# Patient Record
Sex: Female | Born: 1940 | Race: Asian | Hispanic: No | Marital: Married | Smoking: Never smoker
Health system: Southern US, Community
[De-identification: ages and names within clinical notes are randomized; demographics above are authoritative.]

## PROBLEM LIST (undated history)

## (undated) DIAGNOSIS — R040 Epistaxis: Secondary | ICD-10-CM

## (undated) DIAGNOSIS — G459 Transient cerebral ischemic attack, unspecified: Secondary | ICD-10-CM

## (undated) DIAGNOSIS — H409 Unspecified glaucoma: Secondary | ICD-10-CM

## (undated) DIAGNOSIS — I639 Cerebral infarction, unspecified: Secondary | ICD-10-CM

## (undated) DIAGNOSIS — F419 Anxiety disorder, unspecified: Secondary | ICD-10-CM

## (undated) DIAGNOSIS — I77819 Aortic ectasia, unspecified site: Secondary | ICD-10-CM

## (undated) DIAGNOSIS — A159 Respiratory tuberculosis unspecified: Secondary | ICD-10-CM

## (undated) DIAGNOSIS — J189 Pneumonia, unspecified organism: Secondary | ICD-10-CM

## (undated) DIAGNOSIS — I2510 Atherosclerotic heart disease of native coronary artery without angina pectoris: Secondary | ICD-10-CM

## (undated) DIAGNOSIS — R011 Cardiac murmur, unspecified: Secondary | ICD-10-CM

## (undated) DIAGNOSIS — J449 Chronic obstructive pulmonary disease, unspecified: Secondary | ICD-10-CM

## (undated) DIAGNOSIS — T7840XA Allergy, unspecified, initial encounter: Secondary | ICD-10-CM

## (undated) DIAGNOSIS — I10 Essential (primary) hypertension: Secondary | ICD-10-CM

## (undated) DIAGNOSIS — Z954 Presence of other heart-valve replacement: Secondary | ICD-10-CM

## (undated) DIAGNOSIS — Z5189 Encounter for other specified aftercare: Secondary | ICD-10-CM

## (undated) DIAGNOSIS — IMO0002 Reserved for concepts with insufficient information to code with codable children: Secondary | ICD-10-CM

## (undated) DIAGNOSIS — Z952 Presence of prosthetic heart valve: Secondary | ICD-10-CM

## (undated) DIAGNOSIS — D649 Anemia, unspecified: Secondary | ICD-10-CM

## (undated) DIAGNOSIS — I509 Heart failure, unspecified: Secondary | ICD-10-CM

## (undated) DIAGNOSIS — I4891 Unspecified atrial fibrillation: Secondary | ICD-10-CM

## (undated) HISTORY — DX: Reserved for concepts with insufficient information to code with codable children: IMO0002

## (undated) HISTORY — DX: Presence of other heart-valve replacement: Z95.4

## (undated) HISTORY — DX: Aortic ectasia, unspecified site: I77.819

## (undated) HISTORY — DX: Essential (primary) hypertension: I10

## (undated) HISTORY — PX: CARDIAC VALVE REPLACEMENT: SHX585

## (undated) HISTORY — DX: Encounter for other specified aftercare: Z51.89

## (undated) HISTORY — DX: Allergy, unspecified, initial encounter: T78.40XA

## (undated) HISTORY — DX: Anxiety disorder, unspecified: F41.9

## (undated) HISTORY — DX: Pneumonia, unspecified organism: J18.9

## (undated) HISTORY — DX: Anemia, unspecified: D64.9

## (undated) HISTORY — DX: Unspecified atrial fibrillation: I48.91

## (undated) HISTORY — DX: Unspecified glaucoma: H40.9

## (undated) HISTORY — DX: Transient cerebral ischemic attack, unspecified: G45.9

## (undated) HISTORY — PX: CARDIAC SURGERY: SHX584

## (undated) HISTORY — DX: Heart failure, unspecified: I50.9

## (undated) HISTORY — DX: Presence of prosthetic heart valve: Z95.2

## (undated) HISTORY — DX: Cerebral infarction, unspecified: I63.9

## (undated) HISTORY — DX: Cardiac murmur, unspecified: R01.1

## (undated) HISTORY — DX: Respiratory tuberculosis unspecified: A15.9

## (undated) HISTORY — DX: Atherosclerotic heart disease of native coronary artery without angina pectoris: I25.10

## (undated) HISTORY — DX: Chronic obstructive pulmonary disease, unspecified: J44.9

---

## 1997-08-14 ENCOUNTER — Ambulatory Visit (HOSPITAL_COMMUNITY): Admission: RE | Admit: 1997-08-14 | Discharge: 1997-08-14 | Payer: Self-pay | Admitting: Family Medicine

## 1999-01-21 ENCOUNTER — Ambulatory Visit (HOSPITAL_COMMUNITY): Admission: RE | Admit: 1999-01-21 | Discharge: 1999-01-21 | Payer: Self-pay | Admitting: Internal Medicine

## 1999-01-21 ENCOUNTER — Encounter: Payer: Self-pay | Admitting: Internal Medicine

## 2000-03-03 ENCOUNTER — Ambulatory Visit (HOSPITAL_COMMUNITY): Admission: RE | Admit: 2000-03-03 | Discharge: 2000-03-03 | Payer: Self-pay | Admitting: Internal Medicine

## 2000-03-03 ENCOUNTER — Encounter: Payer: Self-pay | Admitting: Internal Medicine

## 2000-07-03 ENCOUNTER — Emergency Department (HOSPITAL_COMMUNITY): Admission: EM | Admit: 2000-07-03 | Discharge: 2000-07-03 | Payer: Self-pay | Admitting: Emergency Medicine

## 2001-03-14 ENCOUNTER — Emergency Department (HOSPITAL_COMMUNITY): Admission: EM | Admit: 2001-03-14 | Discharge: 2001-03-14 | Payer: Self-pay | Admitting: Emergency Medicine

## 2001-07-23 ENCOUNTER — Emergency Department (HOSPITAL_COMMUNITY): Admission: EM | Admit: 2001-07-23 | Discharge: 2001-07-23 | Payer: Self-pay

## 2002-01-17 ENCOUNTER — Encounter: Payer: Self-pay | Admitting: *Deleted

## 2002-01-17 ENCOUNTER — Emergency Department (HOSPITAL_COMMUNITY): Admission: AC | Admit: 2002-01-17 | Discharge: 2002-01-17 | Payer: Self-pay

## 2002-05-08 ENCOUNTER — Inpatient Hospital Stay (HOSPITAL_COMMUNITY): Admission: EM | Admit: 2002-05-08 | Discharge: 2002-05-08 | Payer: Self-pay | Admitting: Emergency Medicine

## 2002-05-08 ENCOUNTER — Encounter: Payer: Self-pay | Admitting: Emergency Medicine

## 2002-05-20 ENCOUNTER — Ambulatory Visit (HOSPITAL_COMMUNITY): Admission: RE | Admit: 2002-05-20 | Discharge: 2002-05-20 | Payer: Self-pay | Admitting: Internal Medicine

## 2002-05-20 ENCOUNTER — Encounter: Payer: Self-pay | Admitting: Internal Medicine

## 2003-07-31 ENCOUNTER — Encounter: Admission: RE | Admit: 2003-07-31 | Discharge: 2003-07-31 | Payer: Self-pay | Admitting: Internal Medicine

## 2005-01-31 ENCOUNTER — Ambulatory Visit: Payer: Self-pay | Admitting: Internal Medicine

## 2006-02-04 ENCOUNTER — Ambulatory Visit: Payer: Self-pay | Admitting: Internal Medicine

## 2008-08-26 ENCOUNTER — Emergency Department (HOSPITAL_COMMUNITY): Admission: EM | Admit: 2008-08-26 | Discharge: 2008-08-26 | Payer: Self-pay | Admitting: Emergency Medicine

## 2009-02-14 ENCOUNTER — Encounter: Payer: Self-pay | Admitting: Internal Medicine

## 2009-07-04 ENCOUNTER — Encounter: Payer: Self-pay | Admitting: Internal Medicine

## 2009-11-02 ENCOUNTER — Encounter: Payer: Self-pay | Admitting: Internal Medicine

## 2010-02-25 ENCOUNTER — Encounter: Payer: Self-pay | Admitting: Internal Medicine

## 2010-03-20 ENCOUNTER — Encounter: Payer: Self-pay | Admitting: Internal Medicine

## 2010-04-12 ENCOUNTER — Encounter: Payer: Self-pay | Admitting: Internal Medicine

## 2010-06-06 NOTE — Letter (Signed)
Summary: [facility] Cardiology Return Visit   [facility] Cardiology Return Visit   Imported By: Roderic Ovens 04/22/2010 15:39:32  _____________________________________________________________________  External Attachment:    Type:   Image     Comment:   External Document

## 2010-06-06 NOTE — Progress Notes (Signed)
Summary: Office Visit  Office Visit   Imported By: Earl Many 04/03/2010 16:48:46  _____________________________________________________________________  External Attachment:    Type:   Image     Comment:   External Document

## 2010-06-06 NOTE — Progress Notes (Signed)
Summary: [facility]   Imported By: Harlon Flor 07/30/2009 16:20:41  _____________________________________________________________________  External Attachment:    Type:   Image     Comment:   External Document

## 2010-06-06 NOTE — Letter (Signed)
Summary: [facility] Return Office Visit Note   [facility] Return Office Visit Note   Imported By: Roderic Ovens 04/12/2010 13:11:22  _____________________________________________________________________  External Attachment:    Type:   Image     Comment:   External Document

## 2010-06-06 NOTE — Letter (Signed)
Summary: [facility]   [facility]   Imported By: Roderic Ovens 11/28/2009 09:47:58  _____________________________________________________________________  External Attachment:    Type:   Image     Comment:   External Document

## 2010-08-14 LAB — URINALYSIS, ROUTINE W REFLEX MICROSCOPIC
Bilirubin Urine: NEGATIVE
Glucose, UA: NEGATIVE mg/dL
Ketones, ur: NEGATIVE mg/dL
Specific Gravity, Urine: 1.006 (ref 1.005–1.030)
pH: 7 (ref 5.0–8.0)

## 2010-08-14 LAB — PROTIME-INR: Prothrombin Time: 22 seconds — ABNORMAL HIGH (ref 11.6–15.2)

## 2010-08-14 LAB — SAMPLE TO BLOOD BANK

## 2010-08-14 LAB — URINE MICROSCOPIC-ADD ON

## 2010-09-15 ENCOUNTER — Emergency Department (HOSPITAL_COMMUNITY)
Admission: EM | Admit: 2010-09-15 | Discharge: 2010-09-15 | Disposition: A | Discharge status: Home / Self Care | Payer: BC Managed Care – PPO | Attending: Emergency Medicine | Admitting: Emergency Medicine

## 2010-09-15 DIAGNOSIS — R040 Epistaxis: Secondary | ICD-10-CM | POA: Insufficient documentation

## 2010-09-15 DIAGNOSIS — I10 Essential (primary) hypertension: Secondary | ICD-10-CM | POA: Insufficient documentation

## 2010-09-15 DIAGNOSIS — I4891 Unspecified atrial fibrillation: Secondary | ICD-10-CM | POA: Insufficient documentation

## 2010-09-15 DIAGNOSIS — Z7901 Long term (current) use of anticoagulants: Secondary | ICD-10-CM | POA: Insufficient documentation

## 2010-09-15 LAB — DIFFERENTIAL
Basophils Absolute: 0 10*3/uL (ref 0.0–0.1)
Eosinophils Absolute: 0 10*3/uL (ref 0.0–0.7)
Eosinophils Relative: 1 % (ref 0–5)
Monocytes Absolute: 0.3 10*3/uL (ref 0.1–1.0)

## 2010-09-15 LAB — CBC
HCT: 39.5 % (ref 36.0–46.0)
MCHC: 32.2 g/dL (ref 30.0–36.0)
MCV: 87.2 fL (ref 78.0–100.0)
Platelets: 250 10*3/uL (ref 150–400)
RDW: 12.8 % (ref 11.5–15.5)
WBC: 4.7 10*3/uL (ref 4.0–10.5)

## 2010-09-15 LAB — PROTIME-INR: INR: 1.79 — ABNORMAL HIGH (ref 0.00–1.49)

## 2010-09-20 NOTE — H&P (Signed)
NAME:  [name], [name] NO.:  [id number]   MEDICAL RECORD NO.:  [id number]                   PATIENT TYPE:  INP   LOCATION:  1830                                 FACILITY:  [facility]   PHYSICIAN:  [facility] Salvia, M.D. [facility]           DATE OF BIRTH:  1941-04-15   DATE OF ADMISSION:  05/08/2002  DATE OF DISCHARGE:                                HISTORY & PHYSICAL   HISTORY OF PRESENT ILLNESS:  The patient is a 70 year old Oriental-American  who presents this evening with chest pain.   She has a long history of hypertension for which she does not take her  medications regularly because the Ziac is associated with sleepiness.  She  has a family history of vascular disease with a stroke in her mom and has  had a transient neurological episode with loss of central vision, the  mechanism which is not clear for which she has seen Candy Sledge, M.D.   This morning she awaken from sleep with a pain in her pain/tightness in her  chest, across her shoulders, and into her left arm.  It was associated with  diaphoresis.  When she took her pulse, which she does on a regular basis,  she noted that it was not the normal 60-70 and regular, but it was very fast  and variable amplitude.  She took her blood pressure via her machine.  It  was noted to be 180/130 with a pulse of 135.  She took an extra Ziac and  brought herself to the emergency room.  By the time she had gotten here, the  symptoms had abated.  She has never had similar symptoms before.  She does  have intermittent left arm discomfort, which is nonexertional.  Her cardiac  risk factors as noted above.  She has no exercise intolerance.  She has no  prior syncope and no prior history of palpitations.   She has noted no change in her weight, change in the texture of her hair,  weight loss, or diaphoresis.   PAST MEDICAL HISTORY:  Notable for epistaxis for which she sees Zola Button T.  Lazarus Salines, M.D.  This has  been a recurring problem requiring multiple visits  to the emergency room and caused her to discontinue the aspirin recommended  as part of her transient neurological process.   Her past medical history is otherwise unrevealing.  Her lipid status is not  known.   PAST SURGICAL HISTORY:  Negative.   MEDICATIONS:  1. Ziac every once in a while.  2. Aspirin less frequently.   ALLERGIES:  She has no known drug allergies.   SOCIAL HISTORY:  She is married.  Currently this is some stress between she  and her husband.  Her 28 year old son is a Ph.D. candidate at [employer] in  Environmental manager.   PHYSICAL EXAMINATION:  GENERAL APPEARANCE:  She is an elderly [place]-  American female in no acute distress.  VITAL SIGNS:  Her blood pressure is 148/96 and her pulse was pulse 64 and  regular.  HEENT:  No scleral icterus nor xanthomata.  NECK:  Neck veins were flat.  The carotids were brisk and full bilaterally  without bruit.  A funduscopic exam was not undertaken.  BACK:  Without kyphosis or scoliosis.  HEART:  The heart sounds were notable for an S4 without murmurs.  ABDOMEN:  Soft with active bowel sounds without midline pulsation or  hepatomegaly.  EXTREMITIES:  Femoral pulses were 2+.  Distal pulses were intact.  There was  no cyanosis, clubbing, or edema.  NEUROLOGIC:  Grossly normal.  SKIN:  Warm and dry.  LYMPHATICS:  There was no lymphadenopathy.   LABORATORY DATA:  The electrocardiogram demonstrated sinus rhythm at 69 with  __________  0.15/0.08/0.037.  There was minor ST segment depression in leads  2, 3, and F and evidence of left atrial enlargement.   IMPRESSION:  1. Chest pain with typical and atypical features, but primarily related to     irregular tachypalpitations suggestive of atrial fibrillation.  2. Hypertension with medication noncompliance secondary to side effects.  3. Recurrent epistaxis on aspirin.  4. Prior transient visual defect, question mechanism.  5. Cardiac risk  factors:     a. Hypertension.     b. Family history with prior strokes.     c. Question cholesterol status.   The patient had tachypalpitations that are irregular that strongly suggest  atrial fibrillation, especially given the familiarity that she has with her  pulse.  This is concerning given her background history of hypertension, as  well as the background of a prior transient neurological episode.  Clarification from the neurologist would be help in that if they felt this  was a thromboembolic event then even without further documentation I think  Coumadin would probably be the right thing.  In the absence of that, ongoing  aspirin therapy and the need for documentation of her arrhythmia would be  crucial.  As she has had epistaxis which has precluded aspirin therapy,  follow-up with Zola Button T. Wolicki, M.D., for a cauterization would be prudent.   Given the chest pain and her risk factors, I think that outpatient  Cardiolite scanning would be appropriate.  Given the abnormalities on her  electrocardiogram, 2-D echocardiogram would be appropriate.  We also want to  check her thyroid given the symptoms that suggest atrial fibrillation.   I plan to see her in the office in about two weeks' time to review the  aforementioned results, as well as to check a BMET following introduction of  an ACE inhibitor for her blood pressure.                                               [facility] Salvia, M.D. [facility]    SCK/MEDQ  D:  05/08/2002  T:  05/08/2002  Job:  [id number]   cc:   Harrel Lemon. Merla Riches, M.D.  [address]  [place]  [place] [address]  Fax: [phone]

## 2010-09-20 NOTE — Discharge Summary (Signed)
NAME:  [name], [name]                     ACCOUNT NO.:  [id number]   MEDICAL RECORD NO.:  [id number]                   PATIENT TYPE:  INP   LOCATION:  2035                                 [facility]ACILITY[facility]  [facility]   PHYSICIAN:  Dr. Damaris Hippo                           DATE OF BIRTH:  [birth year]   DATE OF ADMISSION:  05/08/2002  DATE OF DISCHARGE:                           DISCHARGE SUMMARY - REFERRING   PROCEDURES:  None.   REASON FOR ADMISSION:  Please refer to dictated admission note.   LABORATORY DATA:  WBC 5.6.  HGB 15.  HCT 45.  Platelets 293.  Sodium 141.  Potassium 3.4.  Glucose 96.  BUN 17.  Creatinine 0.6.  CBK-MB Normal times  two: Troponin I 0.01 (times 2), less than 0.01.  TSH pending.  Lipid profile  pending.   HOSPITAL COURSE:  Patient is a 70 year old female, with a prior history of  heart disease, and cardiac risk factors notable for hypertension and family  history of coronary artery disease, who presented to the emergency room with  chest pain and tachy palpitations.  She underwent evaluation by Dr. Sherryl Manges, and was admitted for overnight observation, rule out myocardial  infarction and further diagnostic evaluation.   Serial cardiac enzymes were all within normal limits.   RECOMMENDATIONS:  Proceed with early outpatient exercise stress Cardiolite  testing.  Arrangements were made to have this test done at the [facility]  office the day following discharge.   Laboratory data notable for marginal hypokalemia.  This was completed prior  to discharge.   Medication adjustments included addition of low dose Altace, by Dr. Graciela Husbands.  Patient is recommended to have a follow up metabolic profile with her  primary care physician in approximately one week.   Additional recommendations by Dr. Graciela Husbands were to schedule an outpatient  echocardiogram.  We will arrange for this as well, followed by return to the  office for follow up by Dr. Sherryl Manges.   DISCHARGE MEDICATIONS:   Aspirin 81 mg, __________  2.5 mg every other day,  Altace 2.5. mg q.d.   INSTRUCTIONS:  Patient will be scheduled for an exercise stress Cardiolite  test on Monday, January 5, at [facility].  Arrangements will be made  through our office and patient will be notified at home.  She has been  instructed to refrain from eating or drinking after midnight and to refrain  from any caffeinated beverages on morning of the test.  Patient will also be  scheduled for a 2-D echocardiogram.  Patient will then return to the office  in approximately 2-1/2-3 weeks for cardiac followup with Dr. Sherryl Manges.   DISCHARGE DIAGNOSES:  1. Chest pain.     A. Normal serial cardiac enzymes.     B. Scheduled for outpatient stress Cardiolite testing.  2. Hypertension.  3. Family history of coronary artery disease.  4. Tachy palpitations.  5. Recurrent epistaxis.     A. On aspirin.  6. Mild hypokalemia.     Gene Serpe, P.A. [facility]                      Dr. Damaris Hippo    GS/MEDQ  D:  05/08/2002  T:  05/08/2002  Job:  [id number]   cc:   [facility]  [facility]  [address]

## 2010-09-20 NOTE — Assessment & Plan Note (Signed)
[facility]                           ELECTROPHYSIOLOGY OFFICE NOTE   NAME:[name], [name] KISHI                  MRN:          [id number]  DATE:02/04/2006                            DOB:          1940/05/18    [name] is seen.  She has a history of palpitations and hypertension.  She is doing quite well currently.   She had a couple of issues.  One is concern about jury duty with her  hypertension.  The other is that she is having nasal congestion,  particularly when she sleeps.  Her mattress was last changed 20 years ago.  Her pillow was last changed 2 years ago and she changes her pillowcase every  day.   Her medications include Altace 5 and aspirin 81.   EXAMINATION:  Her blood pressure today was under great control at 128/82,  pulse was 67.  Lungs were clear.  Heart sounds were regular.  The extremity  were without edema.   Electrocardiogram dated today demonstrated 21 with intervals of  0.14/0.08/0.40.  The axis was 58 degrees.   IMPRESSION:  1. Hypertension - well controlled.  2. Palpitations - quiescent.  3. Probable nasal allergies.   [name] is stable.  We will plan to see her again in 1 year's time.  I have given her a letter that says that she is under our care for  hypertension.  I have also suggested that a mattress cover or a new mattress  after 20 years might help with her symptoms.            ______________________________  [facility] Salvia, MD, [facility]     SCK/MedQ  DD:  02/04/2006  DT:  02/05/2006  Job #:  [id number]

## 2010-09-20 NOTE — Assessment & Plan Note (Signed)
[facility]                                   ON-CALL NOTE   NAME:Fangman, CRYSTALL DONALDSON                  MRN:          [id number]  DATE:02/26/2006                            DOB:          [birth year]    PATIENT PHONE NUMBER:  [phone]   HISTORY:  Ms. Glazebrook is a patient followed by Dr. Graciela Husbands with a history of  hypertension and palpitations. She called the answering service tonight with  some concerns. She describes some unusual symptoms that occurred earlier  today and are now resolved. It is unclear as to why she called the answering  service now. She does note, that she had a flu [facility].  She had some discoloration of her lip, as well as some shaking of her upper  lip. She denies chest pain, shortness of breath, palpitations, syncope, pre-  syncope, difficulty swallowing or lip swelling. She denies any numbness or  tingling in her face, facial drooping, aphasia, or unilateral weakness.   PLAN:  I explained to the patient that none of her symptoms seemed to be  concerning. However, if she is concerned about her symptoms earlier today,  then she should go to emergency room. Otherwise, she should followup with  her physician at [facility].   DISPOSITION:  As noted above.    ______________________________  Tereso Newcomer, PA-C    SW/MedQ  DD: 02/26/2006  DT: 02/28/2006  Job #: [id number]

## 2010-09-20 NOTE — Assessment & Plan Note (Signed)
[facility]                                 ON-CALL NOTE   NAME:ESTERLINECandiss, [name]                    MRN:          [id number]  DATE:09/28/2006                            DOB:          May 15, 1940    PHONE NUMBER:  [phone].   PRIMARY CARDIOLOGIST:  [facility] Salvia, M.D., [facility]   HISTORY:  [name] is a 70 year old female patient who has seen Dr.  Graciela Husbands in the past for hypertension and palpitations.  She recently  developed an upper respiratory tract infection and saw Dr. Alwyn Ren with  [facility].  She apparently had a chest x-ray that showed an  enlarged heart.  She was concerned about this and finally decided to  call the answering service today.  She also had some concerns over  whether or not Altace could be causing nasal allergies.   PLAN:  I explained to [name] that with the history of  hypertension for many years, it is not unusual for her to have  cardiomegaly on her chest x-ray.  I did go back through E-chart and  found 2 other chest x-rays that showed mild cardiomegaly.  She was quite  concerned about this and even asked me at one point how long she had to  live.  I explained to her that this was nothing to be too concerned  about.  Followup has already been planned with Dr. Alwyn Ren with [facility].  I explained to her that she needs to seek out that care  as directed by Dr. Alwyn Ren.  I tried to reassure her.  She should await  further testing.  I asked her to follow up with her primary care  Abbas Beyene as well for her nasal allergies.   DISPOSITION:  I asked the patient to contact our office during our  normal operating hours to arrange an appointment with Dr. Graciela Husbands if she  continues to remain concerned about the cardiomegaly found on her chest  x-ray.      Tereso Newcomer, PA-C  Electronically Signed      Jesse Sans. Daleen Squibb, MD, [facility]  Electronically Signed   SW/MedQ  DD: 09/28/2006  DT: 09/28/2006  Job #:  [id number]

## 2010-09-26 ENCOUNTER — Emergency Department (HOSPITAL_COMMUNITY)
Admission: EM | Admit: 2010-09-26 | Discharge: 2010-09-27 | Disposition: A | Discharge status: Home / Self Care | Payer: BC Managed Care – PPO | Attending: Emergency Medicine | Admitting: Emergency Medicine

## 2010-09-26 DIAGNOSIS — I10 Essential (primary) hypertension: Secondary | ICD-10-CM | POA: Insufficient documentation

## 2010-09-26 DIAGNOSIS — R21 Rash and other nonspecific skin eruption: Secondary | ICD-10-CM | POA: Insufficient documentation

## 2010-09-26 DIAGNOSIS — L259 Unspecified contact dermatitis, unspecified cause: Secondary | ICD-10-CM | POA: Insufficient documentation

## 2010-09-26 DIAGNOSIS — I4891 Unspecified atrial fibrillation: Secondary | ICD-10-CM | POA: Insufficient documentation

## 2010-10-25 ENCOUNTER — Emergency Department (HOSPITAL_COMMUNITY)
Admission: EM | Admit: 2010-10-25 | Discharge: 2010-10-26 | Disposition: A | Discharge status: Home / Self Care | Payer: BC Managed Care – PPO | Attending: Emergency Medicine | Admitting: Emergency Medicine

## 2010-10-25 DIAGNOSIS — R040 Epistaxis: Secondary | ICD-10-CM | POA: Insufficient documentation

## 2010-10-25 DIAGNOSIS — Z79899 Other long term (current) drug therapy: Secondary | ICD-10-CM | POA: Insufficient documentation

## 2010-10-25 DIAGNOSIS — I4891 Unspecified atrial fibrillation: Secondary | ICD-10-CM | POA: Insufficient documentation

## 2010-10-25 DIAGNOSIS — Z7982 Long term (current) use of aspirin: Secondary | ICD-10-CM | POA: Insufficient documentation

## 2010-10-25 DIAGNOSIS — I10 Essential (primary) hypertension: Secondary | ICD-10-CM | POA: Insufficient documentation

## 2010-10-25 DIAGNOSIS — Z7901 Long term (current) use of anticoagulants: Secondary | ICD-10-CM | POA: Insufficient documentation

## 2010-10-25 LAB — POCT I-STAT, CHEM 8
BUN: 18 mg/dL (ref 6–23)
Calcium, Ion: 1.22 mmol/L (ref 1.12–1.32)
Chloride: 107 meq/L (ref 96–112)
Creatinine, Ser: 0.7 mg/dL (ref 0.50–1.10)
Glucose, Bld: 109 mg/dL — ABNORMAL HIGH (ref 70–99)
HCT: 42 % (ref 36.0–46.0)
Hemoglobin: 14.3 g/dL (ref 12.0–15.0)
Potassium: 3.9 meq/L (ref 3.5–5.1)
Sodium: 142 mEq/L (ref 135–145)
TCO2: 29 mmol/L (ref 0–100)

## 2010-10-25 LAB — PROTIME-INR: INR: 2.13 — ABNORMAL HIGH (ref 0.00–1.49)

## 2010-10-27 ENCOUNTER — Emergency Department (HOSPITAL_COMMUNITY)
Admission: EM | Admit: 2010-10-27 | Discharge: 2010-10-27 | Disposition: A | Discharge status: Home / Self Care | Payer: Medicare Other | Attending: Emergency Medicine | Admitting: Emergency Medicine

## 2010-10-27 DIAGNOSIS — R040 Epistaxis: Secondary | ICD-10-CM | POA: Insufficient documentation

## 2010-11-01 NOTE — Consult Note (Signed)
  NAMEKIMLA, FURTH NO.:  [id number]  MEDICAL RECORD NO.:  [id number]  LOCATION:  WLED                         FACILITY:  [facility]  PHYSICIAN:  Zola Button T. Lazarus Salines, M.D. DATE OF BIRTH:  [birth year]  DATE OF CONSULTATION:  10/27/2010 DATE OF DISCHARGE:  10/27/2010                                CONSULTATION   CHIEF COMPLAINT:  Epistaxis.  HISTORY OF PRESENT ILLNESS:  A 70 year old female is on Coumadin for episodic atrial fibrillation.  She had some bleeding 6 weeks ago which was cauterized in the emergency room.  She had bleeding again 2 days ago and required packing in the emergency room.  She has been bleeding through the packing.  Her INR at that time was 2.1.  No trauma to the nose.  No pain or obstruction.  She recalls me cauterizing her nose on the opposite side 10+ years ago with good control.  She has no other known bleeding tendencies.  PHYSICAL EXAMINATION:  This is a older middle-aged female in no distress.  There was a balloon pack protruding from her left nostril. Mental status is appropriate.  Ears are clear with normal aerated drums. Oral cavity shows a small amount of old blood.  The anterior nose shows slight scarring in the right septum.  After removing the left nasal balloon pack, there was a small granuloma on the anterior septum.  Neck exam unremarkable.  IMPRESSION:  Left anterior septal granuloma with epistaxis.  Aggravated by Coumadin therapy.  PLAN:  With informed consent, I anesthetized the anterior nose with topical 4% cocaine solution.  After allowing several minutes for this to take effect, silver nitrate cautery was performed on the granuloma with good tolerance and good success.  A small amount of bacitracin ointment was applied.  I discussed nasal hygiene with her including neti pot rinses, saline spray for moistening, and bacitracin ointment twice daily for couple of weeks.  She can resume normal activity.  She can resume  Coumadin therapy.  Recheck in my office 1 month as needed.  She understands and agrees.     Gloris Manchester. Lazarus Salines, M.D.     KTW/MEDQ  D:  10/27/2010  T:  10/27/2010  Job:  [id number]  Electronically Signed by Flo Shanks M.D. on 11/01/2010 08:57:28 AM

## 2011-05-27 ENCOUNTER — Ambulatory Visit (INDEPENDENT_AMBULATORY_CARE_PROVIDER_SITE_OTHER): Payer: Medicare Other

## 2011-05-27 DIAGNOSIS — I4891 Unspecified atrial fibrillation: Secondary | ICD-10-CM

## 2011-05-27 DIAGNOSIS — E559 Vitamin D deficiency, unspecified: Secondary | ICD-10-CM

## 2011-05-27 DIAGNOSIS — I10 Essential (primary) hypertension: Secondary | ICD-10-CM

## 2011-05-27 DIAGNOSIS — Z0000 Encounter for general adult medical examination without abnormal findings: Secondary | ICD-10-CM

## 2011-05-27 DIAGNOSIS — Z23 Encounter for immunization: Secondary | ICD-10-CM

## 2011-06-23 ENCOUNTER — Ambulatory Visit (INDEPENDENT_AMBULATORY_CARE_PROVIDER_SITE_OTHER): Payer: BC Managed Care – PPO | Admitting: Internal Medicine

## 2011-06-23 VITALS — BP 118/68 | HR 64 | Temp 98.1°F | Resp 16 | Ht 63.5 in | Wt 145.5 lb

## 2011-06-23 DIAGNOSIS — I4891 Unspecified atrial fibrillation: Secondary | ICD-10-CM

## 2011-06-23 DIAGNOSIS — I10 Essential (primary) hypertension: Secondary | ICD-10-CM | POA: Insufficient documentation

## 2011-06-23 DIAGNOSIS — R05 Cough: Secondary | ICD-10-CM

## 2011-06-23 DIAGNOSIS — R059 Cough, unspecified: Secondary | ICD-10-CM

## 2011-06-23 LAB — PROTIME-INR: INR: 2.45 — ABNORMAL HIGH (ref <1.50)

## 2011-06-23 MED ORDER — HYDROCODONE-HOMATROPINE 5-1.5 MG/5ML PO SYRP: 5.0000 mL | ORAL_SOLUTION | Freq: Three times a day (TID) | ORAL | Status: DC | PRN

## 2011-06-23 NOTE — Progress Notes (Signed)
  Subjective:    Patient ID: [name], female    DOB: [birth year], 71 y.o.   MRN: [id number]  HPI She developed a fever with diarrhea and lost 7 pounds over 2 days about 6 days ago. As this resolves she developed cough. She can't sleep due to the cough the cough is nonproductive and there is no further fever . She denies signs of a sinus infection and there is no sore throat.  Her atrial fibrillation has been stable and she is follow up with cardiology in 7-10 days. It is time to check her PT and INR so we can forward that to him    Review of Systemshas been stable since her physical examination one month ago     Objective:   Physical Exam  The tympanic membranes are clear, the nose has clear rhinorrhea, oropharynx is clear, there are no anterior cervical nodes. The lungs are clear The heart has a regular rhythm with a rate of 70 Skin shows no excessive bruising     Assessment & Plan:  Problem #1 cough secondary to viral illness She will continue Delsym and add Hycodan at bedtime  Problem #2 atrial fibrillation PT/INR done and will forward that to pharmacist Wynne Dust and her cardiologist

## 2011-06-30 DIAGNOSIS — Z8611 Personal history of tuberculosis: Secondary | ICD-10-CM | POA: Insufficient documentation

## 2011-06-30 DIAGNOSIS — I059 Rheumatic mitral valve disease, unspecified: Secondary | ICD-10-CM | POA: Insufficient documentation

## 2011-06-30 DIAGNOSIS — I4819 Other persistent atrial fibrillation: Secondary | ICD-10-CM | POA: Insufficient documentation

## 2011-07-10 ENCOUNTER — Encounter: Payer: Self-pay | Admitting: Internal Medicine

## 2011-09-02 ENCOUNTER — Telehealth: Payer: Self-pay

## 2011-09-02 NOTE — Telephone Encounter (Signed)
Faxed over the most recent OV and Labs that are in Epic.  I looked in patients chart for an EKG and did not see one.  Also if they are requesting stress test and Drug test they will need to get that from the office that the patient had them done at.  I will need a signed release if they will any further Ov's or labs from this patients chart, before I send anything over to them.

## 2011-09-02 NOTE — Telephone Encounter (Signed)
Cheryl from [facility] requesting pt ov notes, labs, stress test, drug tests anything that will help them do an evaluation on pt she is needing this info before the 7th of may pt has appt on that day. Please contact Elnita Maxwell [phone] fax all info to [phone]

## 2011-09-09 DIAGNOSIS — G459 Transient cerebral ischemic attack, unspecified: Secondary | ICD-10-CM | POA: Insufficient documentation

## 2011-09-14 ENCOUNTER — Ambulatory Visit (INDEPENDENT_AMBULATORY_CARE_PROVIDER_SITE_OTHER): Payer: BC Managed Care – PPO | Admitting: Internal Medicine

## 2011-09-14 VITALS — BP 106/71 | HR 120 | Temp 97.8°F | Resp 16 | Ht 63.5 in | Wt 141.0 lb

## 2011-09-14 DIAGNOSIS — R0602 Shortness of breath: Secondary | ICD-10-CM

## 2011-09-14 DIAGNOSIS — I77810 Thoracic aortic ectasia: Secondary | ICD-10-CM

## 2011-09-14 DIAGNOSIS — I4891 Unspecified atrial fibrillation: Secondary | ICD-10-CM

## 2011-09-14 DIAGNOSIS — I10 Essential (primary) hypertension: Secondary | ICD-10-CM

## 2011-09-14 DIAGNOSIS — G459 Transient cerebral ischemic attack, unspecified: Secondary | ICD-10-CM | POA: Insufficient documentation

## 2011-09-14 DIAGNOSIS — I340 Nonrheumatic mitral (valve) insufficiency: Secondary | ICD-10-CM

## 2011-09-14 LAB — POCT CBC
HCT, POC: 41.7 % (ref 37.7–47.9)
Hemoglobin: 13 g/dL (ref 12.2–16.2)
Lymph, poc: 2.7 (ref 0.6–3.4)
MCH, POC: 26.6 pg — AB (ref 27–31.2)
MCHC: 31.2 g/dL — AB (ref 31.8–35.4)
MCV: 85.2 fL (ref 80–97)
POC LYMPH PERCENT: 49.4 %L (ref 10–50)
RDW, POC: 15.3 %
WBC: 5.5 10*3/uL (ref 4.6–10.2)

## 2011-09-14 LAB — COMPREHENSIVE METABOLIC PANEL
Alkaline Phosphatase: 66 U/L (ref 39–117)
BUN: 17 mg/dL (ref 6–23)
CO2: 29 mEq/L (ref 19–32)
Creat: 0.79 mg/dL (ref 0.50–1.10)
Glucose, Bld: 101 mg/dL — ABNORMAL HIGH (ref 70–99)
Sodium: 140 mEq/L (ref 135–145)
Total Bilirubin: 0.9 mg/dL (ref 0.3–1.2)
Total Protein: 7.1 g/dL (ref 6.0–8.3)

## 2011-09-14 LAB — PROTIME-INR: Prothrombin Time: 21.2 seconds — ABNORMAL HIGH (ref 11.6–15.2)

## 2011-09-14 LAB — BRAIN NATRIURETIC PEPTIDE: Brain Natriuretic Peptide: 1311.7 pg/mL — ABNORMAL HIGH (ref 0.0–100.0)

## 2011-09-14 LAB — TSH: TSH: 0.968 u[IU]/mL (ref 0.350–4.500)

## 2011-09-14 NOTE — Progress Notes (Signed)
Subjective:    Patient ID: [name], female    DOB: 1941/04/19, 71 y.o.   MRN: [id number]  Palpitations  Pertinent negatives include no chest pain, coughing, fever or nausea.   Patient Active Problem List  Diagnoses  . Essential hypertension, benign  . Atrial fibrillation  . Ascending aorta dilatation  . Mitral regurgitation  . TIA (transient ischemic attack)  At her third episode of atrial for in April she was placed on sotalol after she converted. She had another episode in early May and was referred to the electrophysiologist. An ablation is planned. She has now had a loss of rate control over the last 24 hours And has shortness of breath and difficulty talking with any activity. She is stable as long as she sits still. She describes orthopnea ever since starting sotalol as well as fatigue and dyspnea on exertion. She has no edema. She restarted Coumadin 6 weeks ago after stopping it in February because she felt good. She has not had a PT/INR since restarting. She has never had heart failure. She worked in the garden yesterday most of the day before having her heart rate go up again. She has had a few episodes of spontaneous conversion,Most recently on the day she saw the electrophysiologist Dr Cyndy Freeze. Her primary cardiologist at [facility] is Dr. Versie Starks.      Review of Systems  Constitutional: Positive for activity change and fatigue. Negative for fever and unexpected weight change.  Eyes: Negative for visual disturbance.  Respiratory: Negative for cough, choking, chest tightness and wheezing.   Cardiovascular: Positive for palpitations. Negative for chest pain and leg swelling.  Gastrointestinal: Negative for nausea and diarrhea.  Genitourinary: Negative for difficulty urinating.  Musculoskeletal: Negative for back pain.  Psychiatric/Behavioral: Negative for confusion, dysphoric mood and agitation.       Objective:   Physical Exam In no acute distress Pupils  equal round reactive to light and accommodation No carotid bruits Heart with irregular and rapid rhythm rate 125 No murmurs appreciated Lungs are clear Extremities show no peripheral edema and have full peripheral pulses Skin is clear without cyanosis or ecchymoses        Results for orders placed in visit on 09/14/11  POCT CBC      Component Value Range   WBC 5.5  4.6 - 10.2 (K/uL)   Lymph, poc 2.7  0.6 - 3.4    POC LYMPH PERCENT 49.4  10 - 50 (%L)   MID (cbc) 0.3  0 - 0.9    POC MID % 5.9  0 - 12 (%M)   POC Granulocyte 2.5  2 - 6.9    Granulocyte percent 44.7  37 - 80 (%G)   RBC 4.89  4.04 - 5.48 (M/uL)   Hemoglobin 13.0  12.2 - 16.2 (g/dL)   HCT, POC 40.9  81.1 - 47.9 (%)   MCV 85.2  80 - 97 (fL)   MCH, POC 26.6 (*) 27 - 31.2 (pg)   MCHC 31.2 (*) 31.8 - 35.4 (g/dL)   RDW, POC 91.4     Platelet Count, POC 331  142 - 424 (K/uL)   MPV 7.7  0 - 99.8 (fL)    Assessment & Plan:  Problem #1 atrial fibrillation with loss of both rate and rhythm control Since last labs were in January Will recheck metabolic profile and TSH Problem #2 recent fatigue and dyspnea on exertion-possibly secondary to sotalol since there are no signs of heart failure/will check BNP  Problem #3 hypertension With blood pressure 100/70 She is advised to stop altace Problem #4 Coumadin PT/INR checked  Placed a call to [facility]-Dr. Orpah Greek called Dr. Wilford Grist who feels we should let her followup with him tomorrow for medication adjustment for ablation and of course go to the emergency room if she develops shortness of breath or dizziness

## 2011-09-14 NOTE — Progress Notes (Signed)
Subjective:    Patient ID: [name], female    DOB: 1940-07-20, 71 y.o.   MRN: [id number]  HPI Patient Active Problem List  Diagnoses  . Essential hypertension, benign  . Atrial fibrillation  . Ascending aorta dilatation  . Mitral regurgitation  . TIA (transient ischemic attack)  At her third episode of atrial for in April she was placed on sotalol after she converted. She had another episode in early age and was referred to the electrophysiologist. An ablation is planned. She has now had a loss of rate control over the last 24 hours And has shortness of breath and difficulty talking with any activity. She is stable as long as she sits still. She describes orthopnea ever since starting sotalol as well as fatigue and dyspnea on exertion. She has no edema. She restarted Coumadin 6 weeks ago after stopping it in February because she felt good. She has not had a PT/INR since restarting. She has never had heart failure. She worked in the garden yesterday most of the day before having her heart rate go up again. She has had a few episodes of spontaneous conversion,Most recently on the day she saw the electrophysiologist Dr Cyndy Freeze. Her primary cardiologist at [facility] is Dr. Versie Starks.      Review of Systems  Constitutional: Positive for activity change and fatigue. Negative for fever and unexpected weight change.  Eyes: Negative for visual disturbance.  Respiratory: Negative for cough, choking, chest tightness and wheezing.   Cardiovascular: Negative for chest pain and leg swelling.  Gastrointestinal: Negative for nausea and diarrhea.  Genitourinary: Negative for difficulty urinating.  Musculoskeletal: Negative for back pain.  Psychiatric/Behavioral: Negative for confusion, dysphoric mood and agitation.       Objective:   Physical ExamIn no acute distress Pupils equal round reactive to light and accommodation No carotid bruits Heart with irregular and rapid rhythm rate  125 No murmurs appreciated Lungs are clear Extremities show no peripheral edema and have full peripheral pulses Skin is clear without cyanosis or ecchymoses        Results for orders placed in visit on 09/14/11  POCT CBC      Component Value Range   WBC 5.5  4.6 - 10.2 (K/uL)   Lymph, poc 2.7  0.6 - 3.4    POC LYMPH PERCENT 49.4  10 - 50 (%L)   MID (cbc) 0.3  0 - 0.9    POC MID % 5.9  0 - 12 (%M)   POC Granulocyte 2.5  2 - 6.9    Granulocyte percent 44.7  37 - 80 (%G)   RBC 4.89  4.04 - 5.48 (M/uL)   Hemoglobin 13.0  12.2 - 16.2 (g/dL)   HCT, POC 40.9  81.1 - 47.9 (%)   MCV 85.2  80 - 97 (fL)   MCH, POC 26.6 (*) 27 - 31.2 (pg)   MCHC 31.2 (*) 31.8 - 35.4 (g/dL)   RDW, POC 91.4     Platelet Count, POC 331  142 - 424 (K/uL)   MPV 7.7  0 - 99.8 (fL)    Assessment & Plan:  Problem #1 atrial fibrillation with loss of both rate and rhythm control Since last labs were in January Will recheck metabolic profile and TSH Problem #2 recent fatigue and dyspnea on exertion-possibly secondary to sotalol since there are no signs of heart failure Problem #3 hypertension With blood pressure 100/70  It is advisable to stop altace Problem #4 Coumadin PT/INR checked  Placed a call to [facility] cardiology

## 2011-09-15 ENCOUNTER — Encounter: Payer: Self-pay | Admitting: Internal Medicine

## 2011-09-26 ENCOUNTER — Telehealth: Payer: Self-pay

## 2011-09-26 ENCOUNTER — Ambulatory Visit (INDEPENDENT_AMBULATORY_CARE_PROVIDER_SITE_OTHER): Payer: BC Managed Care – PPO | Admitting: Family Medicine

## 2011-09-26 ENCOUNTER — Encounter: Payer: Self-pay | Admitting: Family Medicine

## 2011-09-26 VITALS — BP 130/80 | HR 61 | Temp 98.0°F | Resp 16 | Ht 63.5 in | Wt 142.4 lb

## 2011-09-26 DIAGNOSIS — Z7901 Long term (current) use of anticoagulants: Secondary | ICD-10-CM

## 2011-09-26 DIAGNOSIS — I4891 Unspecified atrial fibrillation: Secondary | ICD-10-CM

## 2011-09-26 LAB — PROTIME-INR: Prothrombin Time: 32.2 seconds — ABNORMAL HIGH (ref 11.6–15.2)

## 2011-09-26 NOTE — Telephone Encounter (Signed)
Spoke with pt--she is coming here to get PT INR Today.

## 2011-09-26 NOTE — Telephone Encounter (Signed)
PATIENT IS TO HAVE SURGERY ON June 3 AT [facility].  SHE IS ON BLOOD THINNER AND SAYS THAT SOME KIND OF NUMBER FROM HER BLOOD WORK SHOWS THAT SHE NEEDS HER DOSAGE RAISED.  SHE SAYS THAT [facility] NEEDS THIS ASAP.  PATIENT OF DOOLITTLE.  SHE PLANS TO CALL BACK WITHIN AN HOUR.

## 2011-09-26 NOTE — Progress Notes (Signed)
  Subjective:    Patient ID: [name], female    DOB: 1941/05/05, 71 y.o.   MRN: [id number]  HPI Patient presents to clinic describing recurrent episodes of atrial fibrillation treated in the past both with  medication and cardioversion.  Patient chronically anticoagulated on coumadin; had used Pradaxa in the past however was unable to  tolerate.  Patient is scheduled for ablation at [facility] 10/06/11 per Dr. Barbara Cower Koontz(Electrophsiology)  Last PT/INR 1.77; 09/25/11; here for repeat BW    Review of Systems     Objective:   Physical Exam  Constitutional: She appears well-developed and well-nourished.  HENT:  Head: Normocephalic and atraumatic.  Cardiovascular: Normal rate, regular rhythm and normal heart sounds.   Pulmonary/Chest: Effort normal and breath sounds normal.  Skin:       Large eccymotic area on (R) anterior thigh; resolving        Assessment & Plan:   1. Encounter for long-term (current) use of anticoagulants  Protime-INR  2. Atrial fibrillation  Ablation procedure scheduled for 6/13    Adjust coumadin once INR back.  Goal INR 2.0-2.5.

## 2011-09-27 ENCOUNTER — Telehealth: Payer: Self-pay | Admitting: Family Medicine

## 2011-09-27 NOTE — Telephone Encounter (Signed)
Left message on home answering machine asking patient to continue current dose of coumadin and to call back when she received our message.  Unable to reach patient by cell phone.

## 2011-10-27 ENCOUNTER — Ambulatory Visit: Payer: BC Managed Care – PPO | Admitting: Internal Medicine

## 2011-11-07 ENCOUNTER — Telehealth: Payer: Self-pay

## 2011-11-07 ENCOUNTER — Ambulatory Visit (INDEPENDENT_AMBULATORY_CARE_PROVIDER_SITE_OTHER): Payer: BC Managed Care – PPO | Admitting: Internal Medicine

## 2011-11-07 VITALS — BP 118/70 | HR 110 | Temp 97.8°F | Resp 18 | Ht 63.5 in | Wt 136.0 lb

## 2011-11-07 DIAGNOSIS — I77810 Thoracic aortic ectasia: Secondary | ICD-10-CM

## 2011-11-07 DIAGNOSIS — I10 Essential (primary) hypertension: Secondary | ICD-10-CM

## 2011-11-07 DIAGNOSIS — I4891 Unspecified atrial fibrillation: Secondary | ICD-10-CM

## 2011-11-07 DIAGNOSIS — I340 Nonrheumatic mitral (valve) insufficiency: Secondary | ICD-10-CM

## 2011-11-07 DIAGNOSIS — G459 Transient cerebral ischemic attack, unspecified: Secondary | ICD-10-CM

## 2011-11-07 LAB — POCT CBC
HCT, POC: 39.8 % (ref 37.7–47.9)
Hemoglobin: 12.4 g/dL (ref 12.2–16.2)
MCH, POC: 25.7 pg — AB (ref 27–31.2)
MPV: 7 fL (ref 0–99.8)
POC MID %: 8.5 %M (ref 0–12)
RBC: 4.82 M/uL (ref 4.04–5.48)
WBC: 4.2 10*3/uL — AB (ref 4.6–10.2)

## 2011-11-07 MED ORDER — DILTIAZEM HCL ER COATED BEADS 120 MG PO CP24: 120.0000 mg | ORAL_CAPSULE | Freq: Every day | ORAL | Status: DC

## 2011-11-07 NOTE — Telephone Encounter (Signed)
EKG re faxed to [facility] at [facility]

## 2011-11-07 NOTE — Progress Notes (Signed)
  Subjective:    Patient ID: [name], female    DOB: 1940-11-03, 71 y.o.   MRN: [id number]  HPIShe is status post recent ablation, and then cardioversion, both unsuccessful. Her atrial arrhythmia gets worse with activity including gardening. She gets an overall feeling of fatigue which has persisted since her ablation. She has no edema but her orthopnea has disappeared since Lasix was started on June 20. Apparently echocardiogram suggested congestive failure. Her nonresponse to treatment for atrial for is thought secondary to a leaky aortic valve  In addition to amiodarone she has Cardizem 30 mg to take 4 times a day when necessary, and she feels this is often necessary.  PT/INR 2.5 on July 3   Review of Systems     Objective:   Physical Exam In no acute distress Vital signs normal Heart irregularly irregular without murmur  lungs clear No peripheral edema   EKG shows continued atrial fib flutter with ventricular response around 100       Assessment & Plan:  Problem #1 atrial fibrillation-resistant treatment Since she is on Lasix, check potassium Fax EKG to [facility] Change Cardizem to 120 mg once a day She is scheduled for evaluation for aortic valve replacement

## 2011-11-07 NOTE — Telephone Encounter (Signed)
MAGGIE A NURSE FROM [facility] STATES THEY NEED THE EKG DR DOOLITTLE JUST DID ON PT. STATES WE SAID WE SENT IT, BUT SHE DIDN'T GET SO WOULD LIKE IT FAXED TO HER OFFICE. PLEASE FAX TO [phone] AND THE PHONE NUMBER IS [phone]

## 2011-11-08 LAB — COMPREHENSIVE METABOLIC PANEL
ALT: 16 U/L (ref 0–35)
CO2: 29 mEq/L (ref 19–32)
Calcium: 9.3 mg/dL (ref 8.4–10.5)
Chloride: 102 mEq/L (ref 96–112)
Creat: 0.79 mg/dL (ref 0.50–1.10)
Glucose, Bld: 86 mg/dL (ref 70–99)
Sodium: 137 mEq/L (ref 135–145)
Total Protein: 7.7 g/dL (ref 6.0–8.3)

## 2011-11-10 ENCOUNTER — Encounter: Payer: Self-pay | Admitting: Internal Medicine

## 2011-11-10 ENCOUNTER — Encounter: Payer: Self-pay | Admitting: Radiology

## 2011-11-10 ENCOUNTER — Telehealth: Payer: Self-pay

## 2011-11-10 NOTE — Telephone Encounter (Signed)
We sent the EKG  -   Now they need the ECHO sent to fax [phone]   Phone [phone]  [facility]

## 2011-11-28 ENCOUNTER — Encounter: Payer: Self-pay | Admitting: Internal Medicine

## 2011-12-09 ENCOUNTER — Encounter: Payer: Self-pay | Admitting: Internal Medicine

## 2011-12-09 ENCOUNTER — Ambulatory Visit (INDEPENDENT_AMBULATORY_CARE_PROVIDER_SITE_OTHER): Payer: BC Managed Care – PPO | Admitting: Family Medicine

## 2011-12-09 VITALS — BP 126/72 | HR 114 | Temp 97.5°F | Resp 16 | Ht 64.0 in | Wt 135.0 lb

## 2011-12-09 DIAGNOSIS — G8929 Other chronic pain: Secondary | ICD-10-CM

## 2011-12-09 DIAGNOSIS — K5900 Constipation, unspecified: Secondary | ICD-10-CM

## 2011-12-09 DIAGNOSIS — K089 Disorder of teeth and supporting structures, unspecified: Secondary | ICD-10-CM

## 2011-12-09 DIAGNOSIS — K219 Gastro-esophageal reflux disease without esophagitis: Secondary | ICD-10-CM

## 2011-12-09 MED ORDER — GABAPENTIN 300 MG PO CAPS: 300.0000 mg | ORAL_CAPSULE | Freq: Every day | ORAL | Status: DC

## 2011-12-09 MED ORDER — ESOMEPRAZOLE MAGNESIUM 40 MG PO CPDR: 40.0000 mg | DELAYED_RELEASE_CAPSULE | Freq: Every day | ORAL | Status: DC

## 2011-12-09 NOTE — Patient Instructions (Addendum)
1. GERD (gastroesophageal reflux disease)  esomeprazole (NEXIUM) 40 MG capsule  2. Chronic dental pain  gabapentin (NEURONTIN) 300 MG capsule    Continue miralax Use dulcolax qod or q3rd day

## 2011-12-09 NOTE — Addendum Note (Signed)
Addended by: Johnnette Litter on: 12/09/2011 06:25 PM   Modules accepted: Orders

## 2011-12-09 NOTE — Progress Notes (Signed)
71 yo woman who is anticipating aortic and mitral valve surgery on 23 August.  She also has atrial fibrillation despite ablation($147K) and cardioversion.  She is having dental problems with pain in the #2 area today.  She saw her dentist at 2 pm today, and x-rays were done.  Her dentist says there is nothing he can do.  She developed palpitations when she was in the dental chair reclining.  Oragel did not help and made her mouth numb.  She also has GERD and stopped the Prilosec July 5th, and the GERD has been keeping her from sleeping supine.   She also has constipation despite Miralax.  Objective:  NAD, elderly woman Oropharynx:  No obvious dental abscess Neck: no adenopathy Chest:  Clear Heart: irreg, 110 bpm, I/VI systolic murmur Ext: trace edema  Assessment:    1. GERD (gastroesophageal reflux disease)  esomeprazole (NEXIUM) 40 MG capsule  2. Chronic dental pain  gabapentin (NEURONTIN) 300 MG capsule    Continue miralax Use dulcolax qod or q3rd day

## 2011-12-10 LAB — PROTIME-INR
INR: 1.88 — ABNORMAL HIGH (ref <1.50)
Prothrombin Time: 22.3 seconds — ABNORMAL HIGH (ref 11.6–15.2)

## 2011-12-19 ENCOUNTER — Telehealth: Payer: Self-pay

## 2011-12-19 MED ORDER — SUCRALFATE 1 GM/10ML PO SUSP: 1.0000 g | Freq: Four times a day (QID) | ORAL | Status: DC

## 2011-12-19 NOTE — Telephone Encounter (Signed)
Called patient to advise , she is having open heart surgery soon and her medications are causing reflux, she is asking if we can advise on anything else to help her with her reflux other than Nexium. Please advise. I asked her if some of the pain could be related to her cardiac issues, and she states no her surgeon and her dentist both have told her it is reflux ( she has receeding gums due to the reflux).

## 2011-12-19 NOTE — Telephone Encounter (Signed)
Pt is asking if she can take 2 nexium everyday please contact her, she says her acid reflux. [phone]

## 2011-12-19 NOTE — Telephone Encounter (Signed)
40 mg daily should be enough to help, if this is not the case please come back in so we can re-evaluate.

## 2011-12-19 NOTE — Telephone Encounter (Signed)
We can try some carafate. I'll send this in to her pharmacy.

## 2011-12-20 NOTE — Telephone Encounter (Signed)
ADVISED PT THAT RX WAS SENT IN FOR CARAFATE

## 2011-12-25 LAB — PULMONARY FUNCTION TEST

## 2011-12-26 DIAGNOSIS — I509 Heart failure, unspecified: Secondary | ICD-10-CM | POA: Insufficient documentation

## 2011-12-26 DIAGNOSIS — K219 Gastro-esophageal reflux disease without esophagitis: Secondary | ICD-10-CM | POA: Insufficient documentation

## 2011-12-26 DIAGNOSIS — I2510 Atherosclerotic heart disease of native coronary artery without angina pectoris: Secondary | ICD-10-CM | POA: Insufficient documentation

## 2011-12-27 DIAGNOSIS — I499 Cardiac arrhythmia, unspecified: Secondary | ICD-10-CM | POA: Insufficient documentation

## 2011-12-27 DIAGNOSIS — I519 Heart disease, unspecified: Secondary | ICD-10-CM | POA: Insufficient documentation

## 2012-01-07 DIAGNOSIS — I079 Rheumatic tricuspid valve disease, unspecified: Secondary | ICD-10-CM | POA: Insufficient documentation

## 2012-01-07 DIAGNOSIS — I719 Aortic aneurysm of unspecified site, without rupture: Secondary | ICD-10-CM | POA: Insufficient documentation

## 2012-01-07 DIAGNOSIS — I359 Nonrheumatic aortic valve disorder, unspecified: Secondary | ICD-10-CM | POA: Insufficient documentation

## 2012-01-26 ENCOUNTER — Ambulatory Visit (INDEPENDENT_AMBULATORY_CARE_PROVIDER_SITE_OTHER): Payer: BC Managed Care – PPO | Admitting: Internal Medicine

## 2012-01-26 VITALS — BP 138/72 | HR 88 | Temp 97.9°F | Resp 16 | Ht 63.0 in | Wt 140.6 lb

## 2012-01-26 DIAGNOSIS — K0889 Other specified disorders of teeth and supporting structures: Secondary | ICD-10-CM

## 2012-01-26 DIAGNOSIS — Z5181 Encounter for therapeutic drug level monitoring: Secondary | ICD-10-CM

## 2012-01-26 DIAGNOSIS — I4891 Unspecified atrial fibrillation: Secondary | ICD-10-CM

## 2012-01-26 DIAGNOSIS — I97190 Other postprocedural cardiac functional disturbances following cardiac surgery: Secondary | ICD-10-CM

## 2012-01-26 DIAGNOSIS — I340 Nonrheumatic mitral (valve) insufficiency: Secondary | ICD-10-CM

## 2012-01-26 DIAGNOSIS — I10 Essential (primary) hypertension: Secondary | ICD-10-CM

## 2012-01-26 DIAGNOSIS — Z7901 Long term (current) use of anticoagulants: Secondary | ICD-10-CM

## 2012-01-26 DIAGNOSIS — I77810 Thoracic aortic ectasia: Secondary | ICD-10-CM

## 2012-01-26 LAB — POCT CBC
Lymph, poc: 1.3 (ref 0.6–3.4)
MCH, POC: 27.2 pg (ref 27–31.2)
MCHC: 29.3 g/dL — AB (ref 31.8–35.4)
MID (cbc): 0.5 (ref 0–0.9)
MPV: 7.3 fL (ref 0–99.8)
POC LYMPH PERCENT: 29 %L (ref 10–50)
POC MID %: 10.5 %M (ref 0–12)
Platelet Count, POC: 26 10*3/uL — AB (ref 142–424)
WBC: 4.5 10*3/uL — AB (ref 4.6–10.2)

## 2012-01-26 MED ORDER — SUCRALFATE 1 GM/10ML PO SUSP: 1.0000 g | Freq: Four times a day (QID) | ORAL | Status: DC

## 2012-01-26 NOTE — Progress Notes (Signed)
Subjective:    Patient ID: [name], female    DOB: 01/11/1941, 71 y.o.   MRN: [id number]  HPIHere for followup of Coumadin anticoagulation/recently decreased to1.0tu thu,2.5 other days Following PT/INR 4.1 on 9/10-She missed followup [facility]-needs recheck//no easy bruising/no bleeding from teeth brushing  Since last visit underwent ablation for chronic atrial fib which was unsuccessful. 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 She feels much better Postop she developed pleural effusion or pulmonary edema and was put on Lasix and potassium 80/40... Has now been reduced to 40lasix/64meqKCL And needs recheck a potassium Denies chest pain shortness of breath or palpitations  Was put on Nexium and Carafate for an odd combination of symptoms but mainly for pain in her teeth with gum recession. She is asymptomatic with regard to typical GERD or LPR symptoms She has not had gastrointestinal evaluation/the chronic dental pain she has no morning is thought due to gum recession from chronic GERD by her dentist and an operative procedures plan to remove a redundant molar. She has to wait another 2 months  Postoperatively with this procedure  She continues on amiodarone/all blood pressure medicines have been discontinued From UTD= a recent meta-analysis, adult patients taking lower doses of amiodarone (152-330 mg daily for at least 12 months) were more likely to develop thyroid, neurologic, skin, ocular, and bradycardic abnormalities than those taking placebo (Vorperian, 1997). Pulmonary toxicity was similar in both the low-dose amiodarone group and in the placebo group, but there was a trend towards increased toxicity in the amiodarone group. Gastrointestinal and hepatic events were seen to a similar extent in both the low-dose amiodarone group and placebo group. As the frequency of adverse events varies  considerably across studies as a function of route and dose, a consolidation of adverse event rates is provided by Goldschlager, 2000. >10%: Cardiovascular: Hypotension (I.V. 16%, refractory in rare cases)  Central nervous system (3% to 40%): Abnormal gait/ataxia, dizziness, fatigue, headache, malaise, impaired memory, involuntary movement, insomnia, poor coordination, peripheral neuropathy, sleep disturbances, tremor  Dermatologic: Photosensitivity (10% to 75%)  Endocrine & Metabolic: Hypothyroidism (1% to 22%)  Gastrointestinal: Nausea, vomiting, anorexia, and constipation (10% to 33%) Hepatic: AST or ALT level >2x normal (15% to 50%)  Ocular: Corneal microdeposits (>90%; causes visual disturbance in <10%) 1% to 10%:  Cardiovascular: CHF (3%), bradycardia (3% to 5%), AV block (5%), conduction abnormalities, SA node dysfunction (1% to 3%), cardiac arrhythmia, flushing, edema. Additional effects associated with I.V. administration include asystole, atrial fibrillation, cardiac arrest, electromechanical dissociation, pulseless electrical activity (PEA), ventricular tachycardia, and cardiogenic shock.  Dermatologic: Slate blue skin discoloration (<10%)  Endocrine & metabolic: Hyperthyroidism (3% to 10%; more common in iodine-deficient regions of the world), libido decreased  Gastrointestinal: Abdominal pain, abnormal salivation, abnormal taste (oral), diarrhea, nausea (I.V.)  Hematologic: Coagulation abnormalities  Hepatic: Hepatitis and cirrhosis (<3%)  Local: Phlebitis (I.V., with concentrations >3 mg/mL)  Ocular: Visual disturbances (2% to 9%), halo vision (<5% occurring especially at night), optic neuritis (1%)  Respiratory: Pulmonary toxicity has been estimated to occur at a frequency between 2% and 7% of patients (some reports indicate a frequency as high as 17%). Toxicity may present as hypersensitivity pneumonitis; pulmonary fibrosis (cough, fever, malaise); pulmonary inflammation;  interstitial pneumonitis; or alveolar pneumonitis. ARDS has been reported in up to 2% of patients receiving amiodarone, and postoperatively in patients receiving oral amiodarone.  Miscellaneous: Abnormal smell (oral)  She reports none of  these side effects today  Review of Systems No fever or chills night sweats or weight loss/no fatigue post surgery No new symptoms    Objective:   Physical Exam Vital signs stable including normal blood pressure off blood pressure medication Heart has a regular rhythm with a rate of 70 His grade 2/6 murmur along the left sternal border Second heart sound is exaggerated but with normal splitting No gallops/no rubs Trace peripheral edema Lungs are clear to auscultation       Assessment & Plan:  Problem #1 over correction with Coumadin-recheck PT/INR Problem #2 edema and pulmonary edema plus effusion postoperatively resolving after treatment with Lasix and potassium-needs recheck of electrolytes Problem #3 PPI therapy-There is not an established diagnosis of GERD She is advised to seek a second dental Opinion at the [facility] dental clinic/Stop Nexium/10 continue Carafate at bedtime as she feels this makes a difference in her early morning dental pain Problem #4 afib  and hypertension resolved Contact with results and plan

## 2012-01-27 ENCOUNTER — Telehealth: Payer: Self-pay | Admitting: Family Medicine

## 2012-01-27 LAB — COMPREHENSIVE METABOLIC PANEL
ALT: 19 U/L (ref 0–35)
AST: 34 U/L (ref 0–37)
Creat: 0.72 mg/dL (ref 0.50–1.10)
Total Bilirubin: 0.7 mg/dL (ref 0.3–1.2)

## 2012-01-27 LAB — PROTIME-INR: Prothrombin Time: 36 seconds — ABNORMAL HIGH (ref 11.6–15.2)

## 2012-01-27 NOTE — Telephone Encounter (Signed)
Patient called about her labs Dr Merla Riches told her that they would be back today

## 2012-01-28 ENCOUNTER — Telehealth: Payer: Self-pay

## 2012-01-28 DIAGNOSIS — I97190 Other postprocedural cardiac functional disturbances following cardiac surgery: Secondary | ICD-10-CM

## 2012-01-28 DIAGNOSIS — Z7901 Long term (current) use of anticoagulants: Secondary | ICD-10-CM

## 2012-01-28 NOTE — Telephone Encounter (Signed)
Notes Recorded by Tonye Pearson, MD on 01/28/2012 at 9:12 AM Call her/potassium is fine The PT INR continues to be elevated above 3 PT 36, INR 3.87 She should contact her physicians at [facility] to see if they want to decrease her Coumadin again       Patient advised of labs and she has questions about why she had to wait for this, at [facility] she gets this information same day, while at office, I explained we have to send this out to the lab, the clinic at [facility] can test this on site. She is also asking about referral to Cardiac rehab. I have put this order in. I have

## 2012-01-28 NOTE — Telephone Encounter (Signed)
Patient is calling about her lab results.  [phone]

## 2012-01-28 NOTE — Telephone Encounter (Signed)
Patient advised of labs. Copy mailed to her.

## 2012-01-29 DIAGNOSIS — Z7901 Long term (current) use of anticoagulants: Secondary | ICD-10-CM | POA: Insufficient documentation

## 2012-01-29 DIAGNOSIS — I97190 Other postprocedural cardiac functional disturbances following cardiac surgery: Secondary | ICD-10-CM | POA: Insufficient documentation

## 2012-02-24 ENCOUNTER — Ambulatory Visit (INDEPENDENT_AMBULATORY_CARE_PROVIDER_SITE_OTHER): Payer: BC Managed Care – PPO | Admitting: Internal Medicine

## 2012-02-24 VITALS — BP 134/84 | HR 81 | Temp 98.1°F | Resp 16 | Wt 135.2 lb

## 2012-02-24 DIAGNOSIS — R5381 Other malaise: Secondary | ICD-10-CM

## 2012-02-24 DIAGNOSIS — H9190 Unspecified hearing loss, unspecified ear: Secondary | ICD-10-CM

## 2012-02-24 DIAGNOSIS — Z23 Encounter for immunization: Secondary | ICD-10-CM

## 2012-02-24 DIAGNOSIS — R5383 Other fatigue: Secondary | ICD-10-CM

## 2012-02-24 DIAGNOSIS — E876 Hypokalemia: Secondary | ICD-10-CM

## 2012-02-24 DIAGNOSIS — E559 Vitamin D deficiency, unspecified: Secondary | ICD-10-CM

## 2012-02-24 DIAGNOSIS — Z79899 Other long term (current) drug therapy: Secondary | ICD-10-CM

## 2012-02-24 DIAGNOSIS — R531 Weakness: Secondary | ICD-10-CM

## 2012-02-24 LAB — POCT CBC
Granulocyte percent: 56.8 %G (ref 37–80)
HCT, POC: 39.9 % (ref 37.7–47.9)
Lymph, poc: 1.4 (ref 0.6–3.4)
MCHC: 29.1 g/dL — AB (ref 31.8–35.4)
MPV: 7.3 fL (ref 0–99.8)
POC Granulocyte: 2.4 (ref 2–6.9)
POC LYMPH PERCENT: 33.8 %L (ref 10–50)
POC MID %: 9.4 %M (ref 0–12)
Platelet Count, POC: 292 10*3/uL (ref 142–424)
RDW, POC: 15.3 %

## 2012-02-24 NOTE — Progress Notes (Signed)
Subjective:    Patient ID: [name], female    DOB: 1940/11/20, 71 y.o.   MRN: [id number]  CC: 71 yo pt c/o L sided hearing loss, L arm pain, and medication question. Patient Active Problem List  Diagnosis  . Essential hypertension, benign  . Atrial fibrillation  . Ascending aorta dilatation  . Mitral regurgitation  . TIA (transient ischemic attack)  . cardiac valve repairs  . Anticoagulated on Coumadin   HPI Pt was helping mow the lawn and afterwards she noticed her hearing was gone in the L ear for several hours.  Then her hearing returned, feels ok but wonders if problems/no pain or d/c Past hx trouble w/ conservation in crowd  Pt also c/o L arm weakness and pain in the area of her upper arm.   She says she first noticed the weakness because she was not able to pull weeds.  She can grab the weeds, but is unable to pull them.  To compensate for the weakness she presses her elbow into her side and this seems to help.  This problem started after her surgery.  She does not have a history of arthritis in her shoulder. No pain. No cahnge in movement  Pt takes colace 2x/day starting after her sx.  She wants to know if she can DC this medication.  BMs fine  She is also concerned about her diet and electrolytes.  She is concerned her potassium might be too high and wants to know if she can eat bananas.  Her insurance company suggested her Vit D levels get checked.     Review of SystemsNo palpitations Fatigue improving-to start cardiac rehabilitation this week Appetite good     Objective:   Physical Exam L ear -Could not hear hair rub, can hear finger rub and snapping/Whisper at 5 feet intact TMs normal  MSK- L arm tender to palpation around shoulder, no pain w/ ROM, no weakness on exam? Grip intact/finger thumb opposition intact/no pain with shoulder abduction/       Assessment & Plan:  Problem #1 weakness left arm-unclear etiology-will send her to shoulder  exercises with followup in-3 wks if not resolved Problem #2 easy fatigue-continue w/cardia rehab Problem #3 Coumadin maintenance-Labs Done Problem #4 postop anemia-recheck today-Is improving Follow up one month as planned  Results for orders placed in visit on 02/24/12  COMPREHENSIVE METABOLIC PANEL      Component Value Range   Sodium 139  135 - 145 mEq/L   Potassium 4.0  3.5 - 5.3 mEq/L   Chloride 101  96 - 112 mEq/L   CO2 32  19 - 32 mEq/L   Glucose, Bld 86  70 - 99 mg/dL   BUN 12  6 - 23 mg/dL   Creat 7.82  9.56 - 2.13 mg/dL   Total Bilirubin 0.9  0.3 - 1.2 mg/dL   Alkaline Phosphatase 95  39 - 117 U/L   AST 26  0 - 37 U/L   ALT 18  0 - 35 U/L   Total Protein 7.7  6.0 - 8.3 g/dL   Albumin 4.3  3.5 - 5.2 g/dL   Calcium 9.2  8.4 - 08.6 mg/dL  LIPID PANEL      Component Value Range   Cholesterol 166  0 - 200 mg/dL   Triglycerides 77  <578 mg/dL   HDL 60  >46 mg/dL   Total CHOL/HDL Ratio 2.8     VLDL 15  0 - 40 mg/dL  LDL Cholesterol 91  0 - 99 mg/dL  PROTIME-INR      Component Value Range   Prothrombin Time 24.0 (*) 11.6 - 15.2 seconds   INR 2.24 (*) <1.50  VITAMIN D 25 HYDROXY      Component Value Range   Vit D, 25-Hydroxy 19 (*) 30 - 89 ng/mL  POCT CBC      Component Value Range   WBC 4.2 (*) 4.6 - 10.2 K/uL   Lymph, poc 1.4  0.6 - 3.4   POC LYMPH PERCENT 33.8  10 - 50 %L   MID (cbc) 0.4  0 - 0.9   POC MID % 9.4  0 - 12 %M   POC Granulocyte 2.4  2 - 6.9   Granulocyte percent 56.8  37 - 80 %G   RBC 4.37  4.04 - 5.48 M/uL   Hemoglobin 11.6 (*) 12.2 - 16.2 g/dL   HCT, POC 16.1  09.6 - 47.9 %   MCV 91.2  80 - 97 fL   MCH, POC 26.5 (*) 27 - 31.2 pg   MCHC 29.1 (*) 31.8 - 35.4 g/dL   RDW, POC 04.5     Platelet Count, POC 292  142 - 424 K/uL   MPV 7.3  0 - 99.8 fL   Problem #5 vitamin D deficiency-50,000 units weekly for 12 weeks and recheck

## 2012-02-24 NOTE — Patient Instructions (Addendum)
Stop Lasix/stop potassium If Exercises do not cure your shoulder in 3 weeks to 4 weeks recheck

## 2012-02-25 LAB — LIPID PANEL
Cholesterol: 166 mg/dL (ref 0–200)
HDL: 60 mg/dL (ref >39)
Total CHOL/HDL Ratio: 2.8 Ratio
VLDL: 15 mg/dL (ref 0–40)

## 2012-02-25 LAB — COMPREHENSIVE METABOLIC PANEL
ALT: 18 U/L (ref 0–35)
AST: 26 U/L (ref 0–37)
BUN: 12 mg/dL (ref 6–23)
CO2: 32 mEq/L (ref 19–32)
Creat: 0.74 mg/dL (ref 0.50–1.10)
Total Bilirubin: 0.9 mg/dL (ref 0.3–1.2)

## 2012-02-25 LAB — PROTIME-INR: INR: 2.24 — ABNORMAL HIGH (ref <1.50)

## 2012-02-26 ENCOUNTER — Telehealth: Payer: Self-pay | Admitting: Family Medicine

## 2012-02-26 ENCOUNTER — Encounter (HOSPITAL_COMMUNITY)
Admission: RE | Admit: 2012-02-26 | Discharge: 2012-02-26 | Disposition: A | Discharge status: Home / Self Care | Payer: BC Managed Care – PPO | Source: Ambulatory Visit | Attending: Cardiology | Admitting: Cardiology

## 2012-02-26 DIAGNOSIS — I059 Rheumatic mitral valve disease, unspecified: Secondary | ICD-10-CM | POA: Insufficient documentation

## 2012-02-26 DIAGNOSIS — Z954 Presence of other heart-valve replacement: Secondary | ICD-10-CM | POA: Insufficient documentation

## 2012-02-26 DIAGNOSIS — Z5189 Encounter for other specified aftercare: Secondary | ICD-10-CM | POA: Insufficient documentation

## 2012-02-26 NOTE — Telephone Encounter (Signed)
[facility] has been calling to check status of Rx- I read Dr. Netta Corrigan note and found: Pt saw Dr. Merla Riches 02/24/12 and was supposed to get a Vit D Rx. I called in Vit D 50,000 units 1 pill weekly #12 with 0 refills- recheck blood work after that. Eileen Stanford

## 2012-02-26 NOTE — Progress Notes (Signed)
Cardiac Rehab Medication Review by a Pharmacist  Does the patient  feel that his/her medications are working for him/her?  yes  Has the patient been experiencing any side effects to the medications prescribed?  Yes. Hands tremble a little and thinks it may be due to the amiodarone but has been getting better.   Does the patient measure his/her own blood pressure or blood glucose at home?  yes   Does the patient have any problems obtaining medications due to transportation or finances?   No  Understanding of regimen: excellent Understanding of indications: excellent Potential of compliance: good    Franchot Erichsen 02/26/2012 8:52 AM

## 2012-03-01 ENCOUNTER — Encounter (HOSPITAL_COMMUNITY)
Admission: RE | Admit: 2012-03-01 | Discharge: 2012-03-01 | Disposition: A | Discharge status: Home / Self Care | Payer: BC Managed Care – PPO | Source: Ambulatory Visit | Attending: Cardiology | Admitting: Cardiology

## 2012-03-02 ENCOUNTER — Encounter (HOSPITAL_COMMUNITY): Payer: Self-pay

## 2012-03-02 NOTE — Progress Notes (Signed)
Pt started cardiac rehab today.  Pt tolerated light exercise without difficulty.  Asymptomatic.  VSS, telemetry-NSR, PVC.  Pt oriented to exercise equipment and routine.  Understanding verbalized.

## 2012-03-03 ENCOUNTER — Encounter (HOSPITAL_COMMUNITY)
Admission: RE | Admit: 2012-03-03 | Discharge: 2012-03-03 | Disposition: A | Discharge status: Home / Self Care | Payer: BC Managed Care – PPO | Source: Ambulatory Visit | Attending: Cardiology | Admitting: Cardiology

## 2012-03-03 ENCOUNTER — Encounter (HOSPITAL_COMMUNITY): Payer: Self-pay

## 2012-03-05 ENCOUNTER — Encounter (HOSPITAL_COMMUNITY)
Admission: RE | Admit: 2012-03-05 | Discharge: 2012-03-05 | Disposition: A | Discharge status: Home / Self Care | Payer: BC Managed Care – PPO | Source: Ambulatory Visit | Attending: Cardiology | Admitting: Cardiology

## 2012-03-05 DIAGNOSIS — Z954 Presence of other heart-valve replacement: Secondary | ICD-10-CM | POA: Insufficient documentation

## 2012-03-05 DIAGNOSIS — I059 Rheumatic mitral valve disease, unspecified: Secondary | ICD-10-CM | POA: Insufficient documentation

## 2012-03-05 DIAGNOSIS — Z5189 Encounter for other specified aftercare: Secondary | ICD-10-CM | POA: Insufficient documentation

## 2012-03-08 ENCOUNTER — Encounter (HOSPITAL_COMMUNITY)
Admission: RE | Admit: 2012-03-08 | Discharge: 2012-03-08 | Disposition: A | Discharge status: Home / Self Care | Payer: BC Managed Care – PPO | Source: Ambulatory Visit | Attending: Cardiology | Admitting: Cardiology

## 2012-03-08 NOTE — Progress Notes (Signed)
Reviewed home exercise with pt today.  Pt plans to walk at home for exercise.  Reviewed THR, pulse, RPE, sign and symptoms, and when to call 911 or MD.  Pt voiced understanding. Arthea Nobel, MA, ACSM RCEP   

## 2012-03-10 ENCOUNTER — Encounter (HOSPITAL_COMMUNITY)
Admission: RE | Admit: 2012-03-10 | Discharge: 2012-03-10 | Disposition: A | Discharge status: Home / Self Care | Payer: BC Managed Care – PPO | Source: Ambulatory Visit | Attending: Cardiology | Admitting: Cardiology

## 2012-03-10 NOTE — Progress Notes (Signed)
[name] 71 y.o. female Nutrition Note Spoke with pt.  Nutrition Plan, Nutrition Survey, and cholesterol goals reviewed with pt. Pt is following Step 2 of the Therapeutic Lifestyle Changes diet. 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 Pt states her stomach swells when she eats any dairy products. Pt reports she has tried Soy milk and Almond milk and she does not like them. Calcium food sources discussed. Pt encouraged to consider taking a Calcium supplement if she is unable to get enough calcium from her diet. Pt on Coumadin. Consistent Vitamin K intake while on Coumadin discussed. Pt concerned about getting enough potassium because "I usually have low potassium levels." Pt's potassium level available over the past year WNL. Pt taking K-Dur. Food sources containing potassium discussed. Per nutrition screen, pt c/o chronic constipation. Pt reports constipation is resolved with Colace and Miralax as well as "fiber gummies." Pt concerned re: getting off of Colace. Pt encouraged to discuss Colace use with her PCP. Pt encouraged to increase fluid consumption, as well as, fruit and vegetables eaten to help with bowel regularity. Pt expressed understanding of the information reviewed. Pt aware of nutrition education classes offered and plans on attending nutrition classes.  Nutrition Diagnosis   Food-and nutrition-related knowledge deficit related to lack of exposure to information as related to diagnosis of: ? CVD   Nutrition RX/ Estimated Daily Nutrition Needs for: wt maintenance 1700-1800 Kcal, 55-60 gm fat, 11-12 gm sat fat, 1.6-1.8 gm trans-fat, <1500 mg sodium   Nutrition Intervention   Pt's individual nutrition plan including cholesterol goals reviewed with pt.   Benefits of adopting Therapeutic Lifestyle Changes discussed when Medficts reviewed.   Pt to attend the Portion Distortion class   Pt to attend the  ? Nutrition I class                     ? Nutrition II class   Pt given  handouts for: ? Consistent vit K diet ? Potassium content of foods ? Calcium content of foods   Continue client-centered nutrition education by RD, as part of interdisciplinary care. Goal(s)   Pt to describe the benefit of including fruits, vegetables and whole grains in a heart healthy meal plan. Monitor and Evaluate progress toward nutrition goal with team. Nutrition Risk: Change to Low

## 2012-03-12 ENCOUNTER — Encounter (HOSPITAL_COMMUNITY)
Admission: RE | Admit: 2012-03-12 | Discharge: 2012-03-12 | Disposition: A | Discharge status: Home / Self Care | Payer: BC Managed Care – PPO | Source: Ambulatory Visit | Attending: Cardiology | Admitting: Cardiology

## 2012-03-15 ENCOUNTER — Encounter (HOSPITAL_COMMUNITY): Payer: BC Managed Care – PPO

## 2012-03-17 ENCOUNTER — Encounter (HOSPITAL_COMMUNITY)
Admission: RE | Admit: 2012-03-17 | Discharge: 2012-03-17 | Disposition: A | Discharge status: Home / Self Care | Payer: BC Managed Care – PPO | Source: Ambulatory Visit | Attending: Cardiology | Admitting: Cardiology

## 2012-03-19 ENCOUNTER — Encounter (HOSPITAL_COMMUNITY): Payer: BC Managed Care – PPO

## 2012-03-22 ENCOUNTER — Encounter (HOSPITAL_COMMUNITY)
Admission: RE | Admit: 2012-03-22 | Discharge: 2012-03-22 | Disposition: A | Discharge status: Home / Self Care | Payer: BC Managed Care – PPO | Source: Ambulatory Visit | Attending: Cardiology | Admitting: Cardiology

## 2012-03-24 ENCOUNTER — Encounter (HOSPITAL_COMMUNITY)
Admission: RE | Admit: 2012-03-24 | Discharge: 2012-03-24 | Disposition: A | Discharge status: Home / Self Care | Payer: BC Managed Care – PPO | Source: Ambulatory Visit | Attending: Cardiology | Admitting: Cardiology

## 2012-03-26 ENCOUNTER — Encounter (HOSPITAL_COMMUNITY)
Admission: RE | Admit: 2012-03-26 | Discharge: 2012-03-26 | Disposition: A | Discharge status: Home / Self Care | Payer: BC Managed Care – PPO | Source: Ambulatory Visit | Attending: Cardiology | Admitting: Cardiology

## 2012-03-29 ENCOUNTER — Ambulatory Visit (INDEPENDENT_AMBULATORY_CARE_PROVIDER_SITE_OTHER): Payer: BC Managed Care – PPO | Admitting: Internal Medicine

## 2012-03-29 ENCOUNTER — Encounter (HOSPITAL_COMMUNITY)
Admission: RE | Admit: 2012-03-29 | Discharge: 2012-03-29 | Disposition: A | Discharge status: Home / Self Care | Payer: BC Managed Care – PPO | Source: Ambulatory Visit | Attending: Cardiology | Admitting: Cardiology

## 2012-03-29 VITALS — BP 132/78 | HR 76 | Temp 97.9°F | Resp 16 | Ht 63.5 in | Wt 136.0 lb

## 2012-03-29 DIAGNOSIS — R1310 Dysphagia, unspecified: Secondary | ICD-10-CM

## 2012-03-29 DIAGNOSIS — Z7901 Long term (current) use of anticoagulants: Secondary | ICD-10-CM

## 2012-03-29 DIAGNOSIS — I10 Essential (primary) hypertension: Secondary | ICD-10-CM

## 2012-03-29 DIAGNOSIS — Z79899 Other long term (current) drug therapy: Secondary | ICD-10-CM

## 2012-03-29 DIAGNOSIS — R634 Abnormal weight loss: Secondary | ICD-10-CM

## 2012-03-29 LAB — POCT CBC
HCT, POC: 37 % — AB (ref 37.7–47.9)
Hemoglobin: 11 g/dL — AB (ref 12.2–16.2)
Lymph, poc: 1.2 (ref 0.6–3.4)
MCH, POC: 26.2 pg — AB (ref 27–31.2)
MCHC: 29.7 g/dL — AB (ref 31.8–35.4)
MCV: 88.1 fL (ref 80–97)
MPV: 7.8 fL (ref 0–99.8)
POC MID %: 6.8 %M (ref 0–12)
RBC: 4.2 M/uL (ref 4.04–5.48)
WBC: 5 10*3/uL (ref 4.6–10.2)

## 2012-03-29 LAB — COMPREHENSIVE METABOLIC PANEL
ALT: 17 U/L (ref 0–35)
AST: 27 U/L (ref 0–37)
Albumin: 4 g/dL (ref 3.5–5.2)
CO2: 30 mEq/L (ref 19–32)
Calcium: 9 mg/dL (ref 8.4–10.5)
Chloride: 101 mEq/L (ref 96–112)
Creat: 0.91 mg/dL (ref 0.50–1.10)
Potassium: 4.3 mEq/L (ref 3.5–5.3)
Sodium: 139 mEq/L (ref 135–145)
Total Protein: 6.9 g/dL (ref 6.0–8.3)

## 2012-03-29 LAB — TSH: TSH: 2.106 u[IU]/mL (ref 0.350–4.500)

## 2012-03-29 NOTE — Patient Instructions (Addendum)
Over the next 3 weeks, stop taking Colace and Miralax. This week, take only 1 dose of Colace a day and your Miralax a day. Next week, only take Miralax once a day. By this time you will be off the stool softeners. You may use Miralax once a day for 3-4 days in a row as needed if you begin to feel constipated.

## 2012-03-29 NOTE — Progress Notes (Signed)
  Subjective:    Patient ID: [name], female    DOB: May 14, 1940, 71 y.o.   MRN: [id number]  HPI Pt presents to clinic today regarding stomach issues and to have her blood drawn. She reports that at Cardiac Rehab her weight fluctuates from 62kg, then to 64.4kg, and then back down to 63kg the next. SHe states she takes Colace BID, Miralax, and OTC Gas Pills. She reports having indigestion, bloating, and gas. She states she has a "phobia" of going into a-fib and is very worried about getting constipated and having to push to hard for a bowel movement. Pt thinks she may have food allergies. She has noticed problems with wheat and milk.  Long-standing.   Continues on Coumadin after recent surgery and needs monitoring today.SHe is transitioning from [facility] back home for followup of Coumadin. Was followed by [facility] before.   no ecchymoses or obvious bleeding at this dose.   She does describe dysphagia which is intermittent. She has a past history of reflux but none in years. She has no early morning burning or nocturnal pain.      Review of Systems No chest pain or palpitations/no shortness of breath/no peripheral edema   sleep good Objective:   Physical Exam Vital signs stable HEENT clear Heart regular Abdomen soft with no masses or organomegaly/bowel sounds present Extremities without edema       Assessment & Plan:   1. Dysphagia  POCT CBC, POCT SEDIMENTATION RATE  2. High risk medication use  Protime-INR, Ambulatory referral to Cardiology  3. Weight loss  POCT SEDIMENTATION RATE, TSH  4. HTN (hypertension)  Comprehensive metabolic panel, Ambulatory referral to Cardiology  5. Anticoagulated on Coumadin     Results for orders placed in visit on 03/29/12  POCT CBC      Component Value Range   WBC 5.0  4.6 - 10.2 K/uL   Lymph, poc 1.2  0.6 - 3.4   POC LYMPH PERCENT 23.5  10 - 50 %L   MID (cbc) 0.3  0 - 0.9   POC MID % 6.8  0 - 12 %M   POC Granulocyte 3.5   2 - 6.9   Granulocyte percent 69.7  37 - 80 %G   RBC 4.20  4.04 - 5.48 M/uL   Hemoglobin 11.0 (*) 12.2 - 16.2 g/dL   HCT, POC 40.9 (*) 81.1 - 47.9 %   MCV 88.1  80 - 97 fL   MCH, POC 26.2 (*) 27 - 31.2 pg   MCHC 29.7 (*) 31.8 - 35.4 g/dL   RDW, POC 91.4     Platelet Count, POC 314  142 - 424 K/uL   MPV 7.8  0 - 99.8 fL  POCT SEDIMENTATION RATE      Component Value Range   POCT SED RATE 44 (*) 0 - 22 mm/hr   Metabolic profile and PT/INR pending To slowly wean off constipation medications that may be causing her current GI distress Hand out given for a elimitation.to try to identify troublesome foods Continue Coumadin/setup followup at [facility]

## 2012-03-31 ENCOUNTER — Encounter (HOSPITAL_COMMUNITY): Payer: BC Managed Care – PPO

## 2012-04-05 ENCOUNTER — Encounter (HOSPITAL_COMMUNITY)
Admission: RE | Admit: 2012-04-05 | Discharge: 2012-04-05 | Disposition: A | Discharge status: Home / Self Care | Payer: BC Managed Care – PPO | Source: Ambulatory Visit | Attending: Cardiology | Admitting: Cardiology

## 2012-04-05 DIAGNOSIS — Z5189 Encounter for other specified aftercare: Secondary | ICD-10-CM | POA: Insufficient documentation

## 2012-04-05 DIAGNOSIS — I059 Rheumatic mitral valve disease, unspecified: Secondary | ICD-10-CM | POA: Insufficient documentation

## 2012-04-05 DIAGNOSIS — Z954 Presence of other heart-valve replacement: Secondary | ICD-10-CM | POA: Insufficient documentation

## 2012-04-07 ENCOUNTER — Encounter (HOSPITAL_COMMUNITY): Payer: BC Managed Care – PPO

## 2012-04-09 ENCOUNTER — Encounter (HOSPITAL_COMMUNITY)
Admission: RE | Admit: 2012-04-09 | Discharge: 2012-04-09 | Disposition: A | Discharge status: Home / Self Care | Payer: BC Managed Care – PPO | Source: Ambulatory Visit | Attending: Cardiology | Admitting: Cardiology

## 2012-04-12 ENCOUNTER — Encounter (HOSPITAL_COMMUNITY)
Admission: RE | Admit: 2012-04-12 | Discharge: 2012-04-12 | Disposition: A | Discharge status: Home / Self Care | Payer: BC Managed Care – PPO | Source: Ambulatory Visit | Attending: Cardiology | Admitting: Cardiology

## 2012-04-14 ENCOUNTER — Encounter (HOSPITAL_COMMUNITY)
Admission: RE | Admit: 2012-04-14 | Discharge: 2012-04-14 | Disposition: A | Discharge status: Home / Self Care | Payer: BC Managed Care – PPO | Source: Ambulatory Visit | Attending: Cardiology | Admitting: Cardiology

## 2012-04-16 ENCOUNTER — Encounter (HOSPITAL_COMMUNITY)
Admission: RE | Admit: 2012-04-16 | Discharge: 2012-04-16 | Disposition: A | Discharge status: Home / Self Care | Payer: BC Managed Care – PPO | Source: Ambulatory Visit | Attending: Cardiology | Admitting: Cardiology

## 2012-04-19 ENCOUNTER — Encounter (HOSPITAL_COMMUNITY)
Admission: RE | Admit: 2012-04-19 | Discharge: 2012-04-19 | Disposition: A | Discharge status: Home / Self Care | Payer: BC Managed Care – PPO | Source: Ambulatory Visit | Attending: Cardiology | Admitting: Cardiology

## 2012-04-21 ENCOUNTER — Encounter (HOSPITAL_COMMUNITY)
Admission: RE | Admit: 2012-04-21 | Discharge: 2012-04-21 | Disposition: A | Discharge status: Home / Self Care | Payer: BC Managed Care – PPO | Source: Ambulatory Visit | Attending: Cardiology | Admitting: Cardiology

## 2012-04-22 ENCOUNTER — Ambulatory Visit (INDEPENDENT_AMBULATORY_CARE_PROVIDER_SITE_OTHER): Payer: BC Managed Care – PPO | Admitting: Cardiology

## 2012-04-22 ENCOUNTER — Encounter: Payer: Self-pay | Admitting: Cardiology

## 2012-04-22 VITALS — BP 139/80 | HR 64 | Ht 63.0 in | Wt 136.0 lb

## 2012-04-22 DIAGNOSIS — Z79899 Other long term (current) drug therapy: Secondary | ICD-10-CM

## 2012-04-22 DIAGNOSIS — Z5181 Encounter for therapeutic drug level monitoring: Secondary | ICD-10-CM

## 2012-04-22 DIAGNOSIS — I77810 Thoracic aortic ectasia: Secondary | ICD-10-CM

## 2012-04-22 DIAGNOSIS — Z7901 Long term (current) use of anticoagulants: Secondary | ICD-10-CM

## 2012-04-22 DIAGNOSIS — I4891 Unspecified atrial fibrillation: Secondary | ICD-10-CM

## 2012-04-22 DIAGNOSIS — I25810 Atherosclerosis of coronary artery bypass graft(s) without angina pectoris: Secondary | ICD-10-CM | POA: Insufficient documentation

## 2012-04-22 NOTE — Progress Notes (Signed)
HPI The patient presents as a new patient for me. She was seen in this clinic some years ago for management of atrial fibrillation. She has been treated extensively at the for management of atrial fibrillation. She also has had mitral regurgitation and descending aortic dilatation. 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 Unfortunately I do not have these records. She wants to establish with this clinic for management of her Coumadin though she will continue to follow at [facility] for a routine followup. She said that previously she had significant symptoms related to atrial fibrillation but this is much improved. She does not feel palpitations. She is doing cardiac rehabilitation. She denies any acute symptoms such as shortness of breath, PND or orthopnea. She has no chest pressure, neck or arm discomfort. She has no weight gain or edema.  Allergies  Allergen Reactions  . Milk-Related Compounds   . Monosodium Glutamate     Headache, dizziness  . Penicillins     "simply doesn't work for me"     Current Outpatient Prescriptions  Medication Sig Dispense Refill  . amiodarone (PACERONE) 200 MG tablet Take 200 mg by mouth daily.       Marland Kitchen aspirin 81 MG tablet Take 81 mg by mouth daily.      . cholecalciferol (VITAMIN D) 1000 UNITS tablet Take 1,000 Units by mouth daily. Stopped taking over the counter Vitamin D 6 months ago. Waiting to pick up prescription Vitamin D for the higher, once weekly dose.      . latanoprost (XALATAN) 0.005 % ophthalmic solution Place 1 drop into both eyes at bedtime.       Marland Kitchen warfarin (COUMADIN) 2.5 MG tablet daily. Takes 1mg  on Sunday, Tuesday, and Thursday Takes 2.5mg  on Monday, Wednesday, Friday and Saturday        Past Medical History  Diagnosis Date  . HTN (hypertension)   . Atrial fibrillation   . TIA (transient ischemic attack)   .  Tuberculosis   . Aortic dilatation     Past Surgical History  Procedure Date  . Cesarean section   . Cardiac surgery     Details pending    Family History  Problem Relation Age of Onset  . CVA Father 23    Died 4    History   Social History  . Marital Status: Married    Spouse Name: N/A    Number of Children: 1  . Years of Education: N/A   Occupational History  .     Social History Main Topics  . Smoking status: Never Smoker   . Smokeless tobacco: Not on file  . Alcohol Use: Not on file  . Drug Use: Not on file  . Sexually Active: Not on file   Other Topics Concern  . Not on file   Social History Narrative  . No narrative on file    ROS:  Positive for seasonal allergies, nosebleeds, constipation. Otherwise as stated in the HPI and negative for all other systems.   PHYSICAL EXAM BP 139/80  Pulse 64  Ht 5\' 3"  (1.6 m)  Wt 136 lb (61.689 kg)  BMI 24.09 kg/m2 GENERAL:  Well appearing HEENT:  Pupils equal round and reactive, fundi not visualized, oral mucosa unremarkable NECK:  No jugular venous distention, waveform within normal limits, carotid upstroke brisk and symmetric, no bruits, no thyromegaly LYMPHATICS:  No cervical, inguinal adenopathy LUNGS:  Clear to auscultation bilaterally BACK:  No CVA tenderness CHEST:  Well healed sternotomy scar. HEART:  PMI not displaced or sustained,S1 and S2 within normal limits, no S3, no S4, no clicks, no rubs, holosystolic apical murmur 3/6,  a 2/6 right upper sternal border systolic murmur early peaking ABD:  Flat, positive bowel sounds normal in frequency in pitch, no bruits, no rebound, no guarding, no midline pulsatile mass, no hepatomegaly, no splenomegaly EXT:  2 plus pulses throughout, trace bilateral lower extremity edema, no cyanosis no clubbing SKIN:  No rashes no nodules NEURO:  Cranial nerves II through XII grossly intact, motor grossly intact throughout PSYCH:  Cognitively intact, oriented to person place  and time   EKG: Sinus rhythm, rate 64, premature atrial contractions, axis rightward, intervals within normal limits, no acute ST-T wave changes.  04/22/2012   ASSESSMENT AND PLAN  Atrial fibrillation - The patient has been established in our warfarin clinic and we will follow this. She will need to be seen by me yearly as long as she is followed in our clinic.  Valvular heart disease - The patient had many questions for me about her valvular heart disease. I reviewed all records that were available to me and searched our computer charts but I could not find her most recent echo, operative note or discharge summary from [facility]. She will be following for her valvular disease with her [facility] cardiologist. However, she would like me to understand her recent history and would still like to be able to ask questions. Therefore, I will obtain these records. No change in therapy is indicated today. We did review of available labs with her.  Hypertension - Her blood pressure appears to be well controlled. She will continue the meds as listed.

## 2012-04-22 NOTE — Patient Instructions (Addendum)
The current medical regimen is effective;  continue present plan and medications.  Follow up in 1 year with Dr Hochrein.  You will receive a letter in the mail 2 months before you are due.  Please call us when you receive this letter to schedule your follow up appointment.  

## 2012-04-23 ENCOUNTER — Encounter (HOSPITAL_COMMUNITY)
Admission: RE | Admit: 2012-04-23 | Discharge: 2012-04-23 | Disposition: A | Discharge status: Home / Self Care | Payer: BC Managed Care – PPO | Source: Ambulatory Visit | Attending: Cardiology | Admitting: Cardiology

## 2012-04-26 ENCOUNTER — Encounter (HOSPITAL_COMMUNITY)
Admission: RE | Admit: 2012-04-26 | Discharge: 2012-04-26 | Disposition: A | Discharge status: Home / Self Care | Payer: BC Managed Care – PPO | Source: Ambulatory Visit | Attending: Cardiology | Admitting: Cardiology

## 2012-04-27 ENCOUNTER — Encounter (HOSPITAL_COMMUNITY): Payer: Self-pay

## 2012-04-27 ENCOUNTER — Telehealth: Payer: Self-pay | Admitting: *Deleted

## 2012-04-27 NOTE — Progress Notes (Signed)
Pt concerned because murmur was detected at recent cardiology consultation with Dr. Antoine Poche.  Murmur also present on  ausculation today.   Pt denies dyspnea, fatigue, chest pain.   Pt advised to discuss presence of murmur with primary cardiologist at [facility].  Offered to call cardiologist for her.  Pt declined, stating she would call herself.  Pt reassured.

## 2012-04-27 NOTE — Addendum Note (Signed)
Encounter addended by: Robyne Peers, RN on: 04/27/2012 10:10 AM<BR>     Documentation filed: Notes Section

## 2012-04-30 ENCOUNTER — Encounter (HOSPITAL_COMMUNITY)
Admission: RE | Admit: 2012-04-30 | Discharge: 2012-04-30 | Disposition: A | Discharge status: Home / Self Care | Payer: BC Managed Care – PPO | Source: Ambulatory Visit | Attending: Cardiology | Admitting: Cardiology

## 2012-04-30 NOTE — Telephone Encounter (Signed)
Telephoned pt and discussed her INR from 04/22/12 of 3.7 and to get her scheduled here in clinic for follow up per Dr Antoine Poche. She states that on 04/25/12 Sunday Dr Antoine Poche called her and told her that the INR reading was 3.7. So she states she has record of dosing chart on what to do with her INRs so she held 2 days and then took 1mg s for 2 days after that then took 2.5mg  yesterday. She states she normally alternates 2.5mg s with 1mg s. Informed pt that when a dose is alternated it changes from one week to the next and that INR results can not be dosed on set scale. Also informed her that  Several things play role in results and dosing and that she should not follow a sliding scale dosing chart for her INRs. Thus, places pt on 2.5mg s Qd except 1mg s on MWF since she already held 2 days this week and took 1mg s back to back. Attempted to schedule her for 1 week out but she states she doesn't want to come in one week, risk explained and reeducation done, she still wants to come in the following week. Thus, appt s/c for 05/10/12.

## 2012-04-30 NOTE — Progress Notes (Signed)
Osa graduates on Monday.  Mariya plans to exercise on her own.

## 2012-05-03 ENCOUNTER — Encounter: Payer: Self-pay | Admitting: Cardiology

## 2012-05-03 ENCOUNTER — Encounter (HOSPITAL_COMMUNITY)
Admission: RE | Admit: 2012-05-03 | Discharge: 2012-05-03 | Disposition: A | Discharge status: Home / Self Care | Payer: BC Managed Care – PPO | Source: Ambulatory Visit | Attending: Cardiology | Admitting: Cardiology

## 2012-05-07 ENCOUNTER — Encounter (HOSPITAL_COMMUNITY): Payer: BC Managed Care – PPO

## 2012-05-10 ENCOUNTER — Encounter (HOSPITAL_COMMUNITY): Payer: BC Managed Care – PPO

## 2012-05-11 ENCOUNTER — Ambulatory Visit (INDEPENDENT_AMBULATORY_CARE_PROVIDER_SITE_OTHER): Payer: BC Managed Care – PPO | Admitting: *Deleted

## 2012-05-11 DIAGNOSIS — I4891 Unspecified atrial fibrillation: Secondary | ICD-10-CM

## 2012-05-11 DIAGNOSIS — Z954 Presence of other heart-valve replacement: Secondary | ICD-10-CM | POA: Insufficient documentation

## 2012-05-11 DIAGNOSIS — Z7901 Long term (current) use of anticoagulants: Secondary | ICD-10-CM | POA: Insufficient documentation

## 2012-05-11 NOTE — Patient Instructions (Signed)

## 2012-05-12 ENCOUNTER — Encounter (HOSPITAL_COMMUNITY): Payer: BC Managed Care – PPO

## 2012-05-14 ENCOUNTER — Encounter (HOSPITAL_COMMUNITY): Payer: BC Managed Care – PPO

## 2012-05-17 ENCOUNTER — Encounter (HOSPITAL_COMMUNITY): Payer: BC Managed Care – PPO

## 2012-05-19 ENCOUNTER — Encounter (HOSPITAL_COMMUNITY): Payer: BC Managed Care – PPO

## 2012-05-21 ENCOUNTER — Encounter (HOSPITAL_COMMUNITY): Payer: BC Managed Care – PPO

## 2012-05-23 ENCOUNTER — Ambulatory Visit (INDEPENDENT_AMBULATORY_CARE_PROVIDER_SITE_OTHER): Payer: BC Managed Care – PPO | Admitting: Internal Medicine

## 2012-05-23 VITALS — BP 114/74 | HR 66 | Temp 98.1°F | Resp 18 | Ht 63.0 in | Wt 137.8 lb

## 2012-05-23 DIAGNOSIS — R109 Unspecified abdominal pain: Secondary | ICD-10-CM

## 2012-05-23 DIAGNOSIS — Z7901 Long term (current) use of anticoagulants: Secondary | ICD-10-CM

## 2012-05-23 DIAGNOSIS — Z5181 Encounter for therapeutic drug level monitoring: Secondary | ICD-10-CM

## 2012-05-23 DIAGNOSIS — E559 Vitamin D deficiency, unspecified: Secondary | ICD-10-CM

## 2012-05-23 DIAGNOSIS — D509 Iron deficiency anemia, unspecified: Secondary | ICD-10-CM

## 2012-05-23 DIAGNOSIS — R142 Eructation: Secondary | ICD-10-CM

## 2012-05-23 LAB — IRON AND TIBC: Iron: 30 ug/dL — ABNORMAL LOW (ref 42–145)

## 2012-05-23 LAB — COMPREHENSIVE METABOLIC PANEL
ALT: 20 U/L (ref 0–35)
BUN: 12 mg/dL (ref 6–23)
CO2: 26 mEq/L (ref 19–32)
Calcium: 8.5 mg/dL (ref 8.4–10.5)
Chloride: 103 mEq/L (ref 96–112)
Creat: 0.72 mg/dL (ref 0.50–1.10)
Total Bilirubin: 1.1 mg/dL (ref 0.3–1.2)

## 2012-05-23 LAB — POCT CBC
Lymph, poc: 1.1 (ref 0.6–3.4)
MCH, POC: 25.3 pg — AB (ref 27–31.2)
MCHC: 30.8 g/dL — AB (ref 31.8–35.4)
MCV: 82.3 fL (ref 80–97)
MID (cbc): 0.3 (ref 0–0.9)
MPV: 7.2 fL (ref 0–99.8)
POC MID %: 8 %M (ref 0–12)
Platelet Count, POC: 264 10*3/uL (ref 142–424)
RBC: 3.67 M/uL — AB (ref 4.04–5.48)
WBC: 3.4 10*3/uL — AB (ref 4.6–10.2)

## 2012-05-23 LAB — PROTIME-INR
INR: 2.2 — ABNORMAL HIGH (ref <1.50)
Prothrombin Time: 23.7 seconds — ABNORMAL HIGH (ref 11.6–15.2)

## 2012-05-23 NOTE — Progress Notes (Signed)
  Subjective:    Patient ID: [name], female    DOB: 02-15-1941, 72 y.o.   MRN: [id number]  HPI here for multiple problems 1) Belching since heart surgery and possibly prior Belches day and night-worse after eating and whenever lying flat Long history of lactose intolerance and has avoided all milk products /Trying elim diets of other foods to see if she can identify cause but none so far Mouth and esoph with burning sensations this am after lots of gas and one belch that brought small amount of material History of diagnosis of GERD about one year ago and use medicines intermittently for a few months but does not have persistent reflux symptoms so stopped Fiber gummy/oatmeal daily Still passing gas but no diarrhea or constipation/tendency to constipation in the past Last colonos 2 y ago at [facility] She is recently seen someone who has gastroscopy and she is afraid this might happen to her  2) complaining of slight discharge right for several days without redness or pain and without known injury/history of glaucoma on meds  No change in vision  3) continues on amiodarone  and wants to know why this is needed post surgery/I deferred this to [facility] cardiology  Pulse rate has been stable/she remains asymptomatic 4) would like PT INR done here today so she can forward this to the Coumadin clinic at [facility].  5) has completed 12 weeks of 50,000 units vitamin D weekly/time for lab recheck   Review of Systems No weight loss No fever chills or night sweats No respiratory symptoms No skin rash No melena or hematochezia    Objective:   Physical Exam BP 114/74  Pulse 66  Temp 98.1 F (36.7 C) (Oral)  Resp 18  Ht 5\' 3"  (1.6 m)  Wt 137 lb 12.8 oz (62.506 kg)  BMI 24.41 kg/m2  SpO2 96% No acute distress Heart regular When lies down for exam has continuous belching for at least a minute Abdomen is soft nontender nondistended Bowel sounds are very active There is no  organomegaly    Results for orders placed in visit on 05/23/12  POCT CBC      Component Value Range   WBC 3.4 (*) 4.6 - 10.2 K/uL   Lymph, poc 1.1  0.6 - 3.4   POC LYMPH PERCENT 33.6  10 - 50 %L   MID (cbc) 0.3  0 - 0.9   POC MID % 8.0  0 - 12 %M   POC Granulocyte 2.0  2 - 6.9   Granulocyte percent 58.4  37 - 80 %G   RBC 3.67 (*) 4.04 - 5.48 M/uL   Hemoglobin 9.3 (*) 12.2 - 16.2 g/dL   HCT, POC 40.9 (*) 81.1 - 47.9 %   MCV 82.3  80 - 97 fL   MCH, POC 25.3 (*) 27 - 31.2 pg   MCHC 30.8 (*) 31.8 - 35.4 g/dL   RDW, POC 91.4     Platelet Count, POC 264  142 - 424 K/uL   MPV 7.2  0 - 99.8 fL       Assessment & Plan:   1. Abdominal pain    2. Vitamin D deficiency    3. Anticoagulated on Coumadin    4. Microcytic anemia     Cmet, fe, tibc, vit d, hemocult Refer for endoscopy to rule out stricture Given her anxiety, aerophagia is possible but is a diagnosis of exclusion Pt/inr

## 2012-05-24 ENCOUNTER — Encounter (HOSPITAL_COMMUNITY): Payer: BC Managed Care – PPO

## 2012-05-24 LAB — HELICOBACTER PYLORI  ANTIBODY, IGM: Helicobacter pylori, IgM: 14.6 U/mL — ABNORMAL HIGH (ref <9.0)

## 2012-05-25 ENCOUNTER — Encounter: Payer: Self-pay | Admitting: Gastroenterology

## 2012-05-26 ENCOUNTER — Encounter: Payer: Self-pay | Admitting: Radiology

## 2012-05-26 ENCOUNTER — Encounter (HOSPITAL_COMMUNITY): Payer: BC Managed Care – PPO

## 2012-05-27 ENCOUNTER — Ambulatory Visit (INDEPENDENT_AMBULATORY_CARE_PROVIDER_SITE_OTHER): Payer: BC Managed Care – PPO | Admitting: Gastroenterology

## 2012-05-27 ENCOUNTER — Telehealth: Payer: Self-pay

## 2012-05-27 ENCOUNTER — Telehealth: Payer: Self-pay | Admitting: Gastroenterology

## 2012-05-27 ENCOUNTER — Encounter: Payer: Self-pay | Admitting: Gastroenterology

## 2012-05-27 ENCOUNTER — Encounter: Payer: Self-pay | Admitting: Internal Medicine

## 2012-05-27 ENCOUNTER — Other Ambulatory Visit: Payer: Self-pay | Admitting: *Deleted

## 2012-05-27 VITALS — BP 140/70 | HR 66 | Ht 63.5 in | Wt 138.8 lb

## 2012-05-27 DIAGNOSIS — R142 Eructation: Secondary | ICD-10-CM | POA: Insufficient documentation

## 2012-05-27 DIAGNOSIS — D649 Anemia, unspecified: Secondary | ICD-10-CM

## 2012-05-27 DIAGNOSIS — D509 Iron deficiency anemia, unspecified: Secondary | ICD-10-CM | POA: Insufficient documentation

## 2012-05-27 DIAGNOSIS — R141 Gas pain: Secondary | ICD-10-CM

## 2012-05-27 DIAGNOSIS — R768 Other specified abnormal immunological findings in serum: Secondary | ICD-10-CM

## 2012-05-27 DIAGNOSIS — K5900 Constipation, unspecified: Secondary | ICD-10-CM

## 2012-05-27 DIAGNOSIS — R894 Abnormal immunological findings in specimens from other organs, systems and tissues: Secondary | ICD-10-CM

## 2012-05-27 MED ORDER — BIS SUBCIT-METRONID-TETRACYC 140-125-125 MG PO CAPS: 3.0000 | ORAL_CAPSULE | Freq: Three times a day (TID) | ORAL | Status: DC

## 2012-05-27 MED ORDER — DEXLANSOPRAZOLE 60 MG PO CPDR: 60.0000 mg | DELAYED_RELEASE_CAPSULE | Freq: Two times a day (BID) | ORAL | Status: DC

## 2012-05-27 NOTE — Telephone Encounter (Signed)
Left message for her, it has been sent in for her, but not from our practice, I will advise her this should be ready for her at the pharmacy.

## 2012-05-27 NOTE — Telephone Encounter (Signed)
Patient with abdominal pain, GERD, and belching.  She has a positive h pylori drawn at Urgent care.  She will come in today and see Doug Sou, PA at 10:45

## 2012-05-27 NOTE — Progress Notes (Addendum)
05/27/2012 [name] [id number] 1940/09/12   HISTORY OF PRESENT ILLNESS:  Patient is a pleasant 72 year old Asian female who presents to our office today at the request of Dr. Merla Riches for her recent positive Hpylori IgM Ab and her belching/bloating.  She went to him complaining of constant belching and bloating in her upper abdomen.  Says that when she is sitting or standing for a while it is better; worse when she is lying down.  No abdominal pain, nausea, or vomiting.  No difficult or painful swallowing or feeling of acid reflux.  This all start about a month or two ago.  She also suffers from chronic constipation, and coincidentally, she started taking 5 fiber gummies a day around the same time.  She had taken Miralax in the past and it worked, but she switched to the fiber instead.  Due to these symptoms, Dr. Merla Riches checked for the Hpylori Ab.  CMP was normal.  CBC showed a Hgb of 9.3 grams.  She was FOBT negative.  Iron was low at 30 and % sat was low at 8, but TIBC was normal; there was no ferritin level checked, but it was recommended that she begin taking an OTC iron supplement.  She had a colonoscopy at [facility] in 09/2010 at which time she had one hyperplastic colonic polyp removed.  She has an extensive cardiac history including aortic root aneurysm, tricuspid regurgitation, aortic regurgitation, CAD, CHF, atrial fibrillation, cardiac caths, several TEE's, and intraoperative cardiac arrest during cardiac surgery (12/2011).  There has been mention of needing heart transplant as well.  She is on coumadin.  This has all been followed at [facility].   Past Medical History  Diagnosis Date  . HTN (hypertension)   . Atrial fibrillation   . TIA (transient ischemic attack)   . Tuberculosis   . Aortic dilatation   . Pneumonia   . Coronary artery disease    Past Surgical History  Procedure Date  . Cesarean section   . Cardiac surgery      Bentalll 25  mm Freestyle porcine aortic valve,  mitral repair 26 mm reein, tricuspid repair 25 mm rein, biatrial maze, ligation of left atrial appendage 12/26/2011    reports that she has never smoked. She has never used smokeless tobacco. She reports that she does not drink alcohol or use illicit drugs. family history includes CVA (age of onset:44) in her father.  There is no history of Colon cancer. Allergies  Allergen Reactions  . Milk-Related Compounds   . Monosodium Glutamate     Headache, dizziness  . Penicillins     "simply doesn't work for me"       Outpatient Encounter Prescriptions as of 05/27/2012  Medication Sig Dispense Refill  . amiodarone (PACERONE) 200 MG tablet Take 200 mg by mouth daily.       Marland Kitchen aspirin 81 MG tablet Take 81 mg by mouth daily.      . cholecalciferol (VITAMIN D) 1000 UNITS tablet Take 1,000 Units by mouth daily. Stopped taking over the counter Vitamin D 6 months ago. Waiting to pick up prescription Vitamin D for the higher, once weekly dose.      . latanoprost (XALATAN) 0.005 % ophthalmic solution Place 1 drop into both eyes at bedtime.       Marland Kitchen warfarin (COUMADIN) 2.5 MG tablet daily. Takes 1mg  on Sunday, Tuesday, and Thursday Takes 2.5mg  on Monday, Wednesday, Friday and Saturday      . bismuth-metronidazole-tetracycline (PLYERA) 140-125-125 MG  per capsule Take 3 capsules by mouth 4 (four) times daily -  before meals and at bedtime.  168 capsule  0  . dexlansoprazole (DEXILANT) 60 MG capsule Take 1 capsule (60 mg total) by mouth 2 (two) times daily.  30 capsule  0     REVIEW OF SYSTEMS  : All other systems reviewed and negative except where noted in the History of Present Illness.   PHYSICAL EXAM: BP 140/70  Pulse 66  Ht 5' 3.5" (1.613 m)  Wt 138 lb 12.8 oz (62.959 kg)  BMI 24.20 kg/m2 General: Well developed asian female in no acute distress Head: Normocephalic and atraumatic Eyes:  sclerae anicteric,conjunctive pink. Ears: Normal auditory acuity Lungs: Clear throughout to  auscultation Heart: Regular rate and rhythm; no M/R/G. Abdomen: Soft, non-tender, non-distended. No masses or hepatomegaly noted. Normal bowel sounds.  Vertical C-section scar noted. Musculoskeletal: Symmetrical with no gross deformities  Skin: No lesions on visible extremities Extremities: No edema  Neurological: Alert oriented x 4, grossly nonfocal Psychological:  Alert and cooperative. Normal mood and affect  ASSESSMENT AND PLAN: -Upper abdominal bloating and belching:  Possible secondary to Hpylori +/- fiber supplements. -Recent positive H pylori IgM Ab -Constipation -Anemia, FOBT negative.  No ferritin level.  ? Iron deficiency.    *Will treat for Hpylori with Pylera therapy.   *She will start the OTC iron supplements for now as directed.   *Because iron supplements can make constipation worse, I have asked her to discontinue her 5 fiber gummies a day and started Miralax daily instead. *Due to her significant cardiac history, we will hold off with performing any EGD procedure for now.  Will see her back in 4-6 weeks for follow-up.  Addendum: Reviewed and agree with initial management. Beverley Fiedler, MD

## 2012-05-27 NOTE — Patient Instructions (Addendum)
You have been given Dexilant samples to take 1 twice daily for 14 days Your Pylera prescription has been sent to your pharmacy Stop taking the Fiber Gummies  Start taking Miralax once daily Follow up with Shanda Bumps in 4 weeks CC:  [name]

## 2012-05-27 NOTE — Telephone Encounter (Signed)
Patient states her labs were called to her and she has an ulcer.  She needs medication for this.  Her next appointment with specialist the end of next month and she will be dead by then.   [facility]  [phone]

## 2012-05-27 NOTE — Telephone Encounter (Signed)
Patient went to today to be seen at [facility], and did get this Rx now she is feeling better.

## 2012-05-28 ENCOUNTER — Telehealth: Payer: Self-pay | Admitting: *Deleted

## 2012-05-28 ENCOUNTER — Encounter (HOSPITAL_COMMUNITY): Payer: BC Managed Care – PPO

## 2012-05-28 NOTE — Telephone Encounter (Signed)
Explained to pt she will f/u with Dr Erick Blinks after see Doug Sou, PA yesterday; I am cancelling the appt with Dr Russella Dar since she has never see him. I offered to mail her an appt letter, but she states she will look on MY CHART.

## 2012-05-29 ENCOUNTER — Encounter: Payer: Self-pay | Admitting: Gastroenterology

## 2012-05-30 ENCOUNTER — Telehealth: Payer: Self-pay | Admitting: Gastroenterology

## 2012-05-30 NOTE — Telephone Encounter (Signed)
Pt c/o nausea and chest burning after taking meds.  She was instructed to hold her last dose of antibiotics but to take prevacid, coumadin tonite.

## 2012-05-30 NOTE — Telephone Encounter (Signed)
Note is ask Shanda Bumps to follow-up with patient after call to Dr. Arlyce Dice

## 2012-05-31 ENCOUNTER — Telehealth: Payer: Self-pay | Admitting: Gastroenterology

## 2012-05-31 ENCOUNTER — Encounter (HOSPITAL_COMMUNITY): Payer: BC Managed Care – PPO

## 2012-05-31 NOTE — Telephone Encounter (Signed)
a 

## 2012-05-31 NOTE — Telephone Encounter (Signed)
See my chart note by Doug Sou, PA.

## 2012-05-31 NOTE — Telephone Encounter (Signed)
Bad night last night. She could not take the antibiotics last night. States she felt so "full" that she could not even take a drop of water. She called the on call MD and was told to hold the dose. She took her antibiotic today after eating 1/2 cup oatmeal and did ok. However, she continues to feel like her stomach is full and cannot drink water. She is worried that she had to skip a dose last night and that she is feeling so "full". She is also concerned this is a drug reaction. She states the Amiodarone she is on causes lots of problems with other drugs. She denies vomiting or burning just the full feeling. She sent a patient e mail last night and states she has not heard from it. Please, advise.

## 2012-06-02 ENCOUNTER — Encounter (HOSPITAL_COMMUNITY): Payer: BC Managed Care – PPO

## 2012-06-04 ENCOUNTER — Telehealth: Payer: Self-pay | Admitting: *Deleted

## 2012-06-04 ENCOUNTER — Encounter (HOSPITAL_COMMUNITY): Payer: BC Managed Care – PPO

## 2012-06-04 ENCOUNTER — Other Ambulatory Visit: Payer: Self-pay | Admitting: *Deleted

## 2012-06-04 DIAGNOSIS — R142 Eructation: Secondary | ICD-10-CM

## 2012-06-04 DIAGNOSIS — R140 Abdominal distension (gaseous): Secondary | ICD-10-CM

## 2012-06-04 NOTE — Telephone Encounter (Signed)
Per Doug Sou, PA, schedule patient for UGI. Scheduled at [facility] radiology with Tiffany on 06/11/12 at 9:15/9:30 AM. NPO after midnight.Patient given date, time and instructions.

## 2012-06-06 ENCOUNTER — Encounter: Payer: Self-pay | Admitting: Gastroenterology

## 2012-06-07 ENCOUNTER — Encounter (HOSPITAL_COMMUNITY): Payer: BC Managed Care – PPO | Attending: Cardiology

## 2012-06-07 ENCOUNTER — Telehealth: Payer: Self-pay | Admitting: Internal Medicine

## 2012-06-07 DIAGNOSIS — I059 Rheumatic mitral valve disease, unspecified: Secondary | ICD-10-CM | POA: Insufficient documentation

## 2012-06-07 DIAGNOSIS — Z5189 Encounter for other specified aftercare: Secondary | ICD-10-CM | POA: Insufficient documentation

## 2012-06-07 DIAGNOSIS — Z954 Presence of other heart-valve replacement: Secondary | ICD-10-CM | POA: Insufficient documentation

## 2012-06-07 NOTE — Telephone Encounter (Signed)
Pt states she was only given enough Pylera for 10 days of therapy, not 14. Explained to pt , usual therapy is 10 days, but I will confirm this with Doug Sou, PA. Also she wants to know if she should continue the Dexilant; she was given samples in our ofc, not a script? When asked how she was feeling she mentioned bloating and burping with taking Pylera. When questioned about her s&s now she denies any problems with those, but under her breath she stated she did not want to take any more AB again. Does pt continue with Dexilant? Thanks.

## 2012-06-07 NOTE — Telephone Encounter (Signed)
[name] called back to have pt stop the Dexilant; it was used along with Pylera for her belching and bloating. Informed pt to stop the Dexilant; pt stated understanding and will call for problems.

## 2012-06-09 ENCOUNTER — Encounter (HOSPITAL_COMMUNITY): Payer: BC Managed Care – PPO

## 2012-06-11 ENCOUNTER — Ambulatory Visit (HOSPITAL_COMMUNITY)
Admission: RE | Admit: 2012-06-11 | Discharge: 2012-06-11 | Disposition: A | Discharge status: Home / Self Care | Payer: BC Managed Care – PPO | Source: Ambulatory Visit | Attending: Gastroenterology | Admitting: Gastroenterology

## 2012-06-11 ENCOUNTER — Encounter (HOSPITAL_COMMUNITY): Payer: BC Managed Care – PPO

## 2012-06-11 DIAGNOSIS — R141 Gas pain: Secondary | ICD-10-CM | POA: Insufficient documentation

## 2012-06-11 DIAGNOSIS — R142 Eructation: Secondary | ICD-10-CM | POA: Insufficient documentation

## 2012-06-11 DIAGNOSIS — R140 Abdominal distension (gaseous): Secondary | ICD-10-CM

## 2012-06-11 DIAGNOSIS — K224 Dyskinesia of esophagus: Secondary | ICD-10-CM | POA: Insufficient documentation

## 2012-06-14 ENCOUNTER — Ambulatory Visit: Payer: BC Managed Care – PPO | Admitting: Gastroenterology

## 2012-06-14 ENCOUNTER — Encounter (HOSPITAL_COMMUNITY): Payer: BC Managed Care – PPO

## 2012-06-16 ENCOUNTER — Encounter (HOSPITAL_COMMUNITY): Payer: BC Managed Care – PPO

## 2012-06-18 ENCOUNTER — Encounter (HOSPITAL_COMMUNITY): Payer: BC Managed Care – PPO

## 2012-06-20 ENCOUNTER — Encounter: Payer: Self-pay | Admitting: Gastroenterology

## 2012-06-21 ENCOUNTER — Encounter: Payer: Self-pay | Admitting: Internal Medicine

## 2012-06-21 ENCOUNTER — Encounter (HOSPITAL_COMMUNITY): Payer: BC Managed Care – PPO

## 2012-06-22 ENCOUNTER — Ambulatory Visit (INDEPENDENT_AMBULATORY_CARE_PROVIDER_SITE_OTHER): Payer: BC Managed Care – PPO | Admitting: Internal Medicine

## 2012-06-22 ENCOUNTER — Telehealth: Payer: Self-pay | Admitting: Gastroenterology

## 2012-06-22 ENCOUNTER — Encounter: Payer: Self-pay | Admitting: Internal Medicine

## 2012-06-22 ENCOUNTER — Telehealth: Payer: Self-pay | Admitting: Internal Medicine

## 2012-06-22 ENCOUNTER — Other Ambulatory Visit (INDEPENDENT_AMBULATORY_CARE_PROVIDER_SITE_OTHER): Payer: BC Managed Care – PPO

## 2012-06-22 VITALS — BP 150/80 | HR 68 | Ht 63.5 in | Wt 136.4 lb

## 2012-06-22 DIAGNOSIS — A048 Other specified bacterial intestinal infections: Secondary | ICD-10-CM

## 2012-06-22 DIAGNOSIS — R143 Flatulence: Secondary | ICD-10-CM

## 2012-06-22 DIAGNOSIS — D649 Anemia, unspecified: Secondary | ICD-10-CM

## 2012-06-22 DIAGNOSIS — R109 Unspecified abdominal pain: Secondary | ICD-10-CM

## 2012-06-22 DIAGNOSIS — IMO0001 Reserved for inherently not codable concepts without codable children: Secondary | ICD-10-CM

## 2012-06-22 DIAGNOSIS — K224 Dyskinesia of esophagus: Secondary | ICD-10-CM | POA: Insufficient documentation

## 2012-06-22 DIAGNOSIS — R142 Eructation: Secondary | ICD-10-CM

## 2012-06-22 DIAGNOSIS — R141 Gas pain: Secondary | ICD-10-CM

## 2012-06-22 DIAGNOSIS — R140 Abdominal distension (gaseous): Secondary | ICD-10-CM

## 2012-06-22 LAB — PROTIME-INR: INR: 2.3 ratio — ABNORMAL HIGH (ref 0.8–1.0)

## 2012-06-22 LAB — CBC
Hemoglobin: 10.5 g/dL — ABNORMAL LOW (ref 12.0–15.0)
MCHC: 32.6 g/dL (ref 30.0–36.0)
Platelets: 223 10*3/uL (ref 150.0–400.0)
RBC: 3.85 Mil/uL — ABNORMAL LOW (ref 3.87–5.11)

## 2012-06-22 LAB — IBC PANEL: Saturation Ratios: 12.9 % — ABNORMAL LOW (ref 20.0–50.0)

## 2012-06-22 MED ORDER — SIMETHICONE 40 MG/0.6ML PO SUSP: 40.0000 mg | Freq: Four times a day (QID) | ORAL | Status: DC | PRN

## 2012-06-22 NOTE — Patient Instructions (Addendum)
You have been scheduled for an H pylori breath test tomorrow at 8:45am  Your physician has requested that you go to the basement for lab work before leaving today.  Take Mylicon drops daily you can get these over the counter.

## 2012-06-22 NOTE — Telephone Encounter (Signed)
lvm cancelling H-pylori breath test. Pt needs to be off pylara for 4 weeks.  She stopped it on 06/08/2012

## 2012-06-22 NOTE — Progress Notes (Signed)
Patient ID: KITZIA CAMUS, female   DOB: 07/06/1940, 72 y.o.   MRN: [id number]  SUBJECTIVE: HPI Mrs. Wombles is a 72 yo female with PMH of atrial fibrillation, hypertension, TIA, CAD, H. pylori who seen in followup. She was last seen on 05/27/2012 after having a positive H. pylori antibody along with abdominal bloating with frequent belching. She completed antibiotic therapy with Pylera, which she took for 10 days. She was also on PPI during this period.  The medication was somewhat difficult for her to take, but she reports having completed it fully.  She continues to report abdominal bloating, though this is somewhat better. She denies epigastric pain. There is no nausea or vomiting. Milk products specifically make her bloating worse. She was using simethicone drops which she reports helped her bloating dramatically. She has not use this daily because she wasn't sure this was okay. She is having daily bowel movements without blood or melena.  No fevers or chills. She request that her vitamin D, iron, B12, and INR be tested  Of note she does have a history of borderline low iron levels, without ferritin previously checked. She has remained on daily iron supplementation. She was FOBT negative by her primary care within the last month or so. She reportedly had a colonoscopy in May 2012 at [facility] with one hyperplastic polyp removed. She remains on warfarin for her history of atrial fibrillation. She also has a history of cardiac surgery with intraoperative cardiac arrest in August 2013.  Review of Systems  As per history of present illness, otherwise negative   Past Medical History  Diagnosis Date  . HTN (hypertension)   . Atrial fibrillation   . TIA (transient ischemic attack)   . Tuberculosis   . Aortic dilatation   . Pneumonia   . Coronary artery disease     Current Outpatient Prescriptions  Medication Sig Dispense Refill  . amiodarone (PACERONE) 200 MG tablet Take 200 mg by mouth daily.        Marland Kitchen aspirin 81 MG tablet Take 81 mg by mouth daily.      . cholecalciferol (VITAMIN D) 1000 UNITS tablet Take 1,000 Units by mouth daily. Stopped taking over the counter Vitamin D 6 months ago. Waiting to pick up prescription Vitamin D for the higher, once weekly dose.      . ferrous sulfate 325 (65 FE) MG EC tablet Take 325 mg by mouth daily.      Marland Kitchen latanoprost (XALATAN) 0.005 % ophthalmic solution Place 1 drop into both eyes at bedtime.       Marland Kitchen warfarin (COUMADIN) 2.5 MG tablet daily. Takes 1mg  on Sunday, Tuesday, and Thursday Takes 2.5mg  on Monday, Wednesday, Friday and Saturday      . simethicone (MYLICON) 40 MG/0.6ML drops Take 0.6 mLs (40 mg total) by mouth 4 (four) times daily as needed.  30 mL  0   No current facility-administered medications for this visit.    Allergies  Allergen Reactions  . Milk-Related Compounds   . Monosodium Glutamate     Headache, dizziness  . Penicillins     "simply doesn't work for me"     Family History  Problem Relation Age of Onset  . CVA Father 30    Died 63  . Colon cancer Neg Hx     History  Substance Use Topics  . Smoking status: Never Smoker   . Smokeless tobacco: Never Used  . Alcohol Use: No    OBJECTIVE: BP 150/80  Pulse  68  Ht 5' 3.5" (1.613 m)  Wt 136 lb 6 oz (61.859 kg)  BMI 23.78 kg/m2 Constitutional: Well-developed and well-nourished. No distress. HEENT: Normocephalic and atraumatic. Oropharynx is clear and moist. No oropharyngeal exudate. No scleral icterus. Cardiovascular: Normal rate, regular rhythm  Pulmonary/chest: Effort normal and breath sounds normal. No wheezing, rales or rhonchi. Abdominal: Soft, nontender, nondistended. Bowel sounds active throughout. There are no masses palpable. No hepatosplenomegaly. Extremities: no clubbing, cyanosis, or edema Neurological: Alert and oriented to person place and time. Skin: Skin is warm and dry. No rashes noted. Psychiatric: Normal mood and affect. Behavior is  normal.  Labs CBC    Component Value Date/Time   WBC 3.9* 06/22/2012 1055   WBC 3.4* 05/23/2012 1157   RBC 3.85* 06/22/2012 1055   RBC 3.67* 05/23/2012 1157   HGB 10.5* 06/22/2012 1055   HGB 9.3* 05/23/2012 1157   HCT 32.1* 06/22/2012 1055   HCT 30.2* 05/23/2012 1157   PLT 223.0 06/22/2012 1055   MCV 83.3 06/22/2012 1055   MCV 82.3 05/23/2012 1157   MCH 25.3* 05/23/2012 1157   MCH 28.0 09/15/2010 1210   MCHC 32.6 06/22/2012 1055   MCHC 30.8* 05/23/2012 1157   RDW 22.8* 06/22/2012 1055   LYMPHSABS 1.4 09/15/2010 1210   MONOABS 0.3 09/15/2010 1210   EOSABS 0.0 09/15/2010 1210   BASOSABS 0.0 09/15/2010 1210   Clinical Data:  Bloating and excessive belching.  History of H pylori infection.   UPPER GI SERIES WITH KUB   Technique:  Routine upper GI series was performed with thin and high density barium.   Fluoroscopy Time: 6.1 minutes   Comparison:  None.   Findings: Double contrast evaluation of the esophagus demonstrates no mucosal abnormality.   Double contrast evaluation of the stomach demonstrates no ulcer or mass.  Normal fold thickness.   Normal appearance of the duodenal bulb and C-loop.   Evaluation of primary peristalsis demonstrates contrast stasis throughout the mid and lower thoracic esophagus.   Full column evaluation of the esophagus demonstrates no persistent narrowing to suggest stricture. Anterior impression upon the lower thoracic esophageal column is favored to be due to an enlarged left atrium.  Example series 48.  Incidental note is made of median sternotomy and atrial appendage clipping.   Scout film demonstrates a nonobstructive bowel gas pattern. Phleboliths in the pelvis.   IMPRESSION:   1.  Esophageal dysmotility, likely presbyesophagus.  Moderate in severity. 2.  Normal appearance of the stomach and esophagus.   ASSESSMENT AND PLAN: 72 yo female with PMH of atrial fibrillation, hypertension, TIA, CAD, H. pylori who seen in followup.   1. H pylori  infection/abd bloating with belching -- the patient completed triple therapy for H. pylori infection, and requests confirmation eradication which is reasonable. She is now off PPI and therefore we will order a urease breath test to confirm eradication.  Her only abdominal complaint is continued abdominal bloating, which seemed to respond very favorably to simethicone drops. I recommended that she resume daily Mylicon drops per box instructions. This may be enough to control her abdominal bloating. Specifically she's not having any abdominal pain, nausea, vomiting. Her upper GI showed esophageal dysmotility which we discussed today but otherwise normal stomach and esophagus.  2.  Anemia, heme negative -- she has remained on iron, I will repeat her iron studies today. CBC shows some improvement in her anemia with a hemoglobin of 10.5 today up from 9.3 at last check.  3.  Esophageal dysmotility --  this likely explains her intermittent issues with dysphagia, which are minor symptom at present. We discussed that there is no real treatment for this problem, but she is asked to notify us if she feels like her dysphagia worsens or if she develops any odynophagia

## 2012-06-23 ENCOUNTER — Encounter (HOSPITAL_COMMUNITY): Payer: BC Managed Care – PPO

## 2012-06-23 ENCOUNTER — Telehealth: Payer: Self-pay | Admitting: *Deleted

## 2012-06-23 LAB — VITAMIN D 25 HYDROXY (VIT D DEFICIENCY, FRACTURES): Vit D, 25-Hydroxy: 20 ng/mL — ABNORMAL LOW (ref 30–89)

## 2012-06-23 MED ORDER — VITAMIN D3 1.25 MG (50000 UT) PO CAPS: 1.0000 | ORAL_CAPSULE | ORAL | Status: DC

## 2012-06-23 NOTE — Telephone Encounter (Signed)
Informed pt of the need for Vit D replacement. She stated understanding with the dose. She then asked why is she is on Iron and does she have to take it? Explained Dr Merla Riches ordered the med and gave her results and informed her that if her Iron comes up, maybe her H&H will rise and she will have more energy. She still doesn't want to take it and I informed her we can't make her take it, only suggest it. She also states she doesn't like the Miralax and wants to take her 5 fiber gummies again. Explained I will ask Dr Rhea Belton and get back with her. OK for pt to stop iron and to start 5 fiber gummies instead of Miralax? Thanks.

## 2012-06-23 NOTE — Telephone Encounter (Signed)
Message copied by Florene Glen on Wed Jun 23, 2012  2:01 PM ------      Message from: Beverley Fiedler      Created: Wed Jun 23, 2012  8:24 AM       Vit D remains low.      Would recommend Vit D 50,000 Units PO every 7 days for 6 weeks, then return to 1000 Units daily      Thanks ------

## 2012-06-24 NOTE — Telephone Encounter (Signed)
Okay to use fiber gummies in place of Miralax if this is successfully in promoting bowel regularity for her Iron studies have improved, but anemia persists, and I will defer the iron decision to Dr. Merla Riches

## 2012-06-24 NOTE — Telephone Encounter (Signed)
Informed pt she may use the fiber gummies if they are successful in promoting regularity. As far as the Iron, Dr Rhea Belton states she will have to discuss stopping the iron with Dr Yong Channel; pt stated understanding.

## 2012-06-25 ENCOUNTER — Encounter (HOSPITAL_COMMUNITY): Payer: BC Managed Care – PPO

## 2012-06-28 ENCOUNTER — Encounter (HOSPITAL_COMMUNITY): Payer: BC Managed Care – PPO

## 2012-06-30 ENCOUNTER — Encounter (HOSPITAL_COMMUNITY): Payer: BC Managed Care – PPO

## 2012-07-02 ENCOUNTER — Telehealth: Payer: Self-pay | Admitting: Internal Medicine

## 2012-07-02 ENCOUNTER — Encounter (HOSPITAL_COMMUNITY): Payer: BC Managed Care – PPO

## 2012-07-02 NOTE — Telephone Encounter (Signed)
Pt has scheduled her breath test for 07/09/2012 @ 8:30am.

## 2012-07-03 ENCOUNTER — Emergency Department (HOSPITAL_COMMUNITY)
Admission: EM | Admit: 2012-07-03 | Discharge: 2012-07-04 | Disposition: A | Discharge status: Home / Self Care | Payer: BC Managed Care – PPO | Attending: Emergency Medicine | Admitting: Emergency Medicine

## 2012-07-03 ENCOUNTER — Encounter (HOSPITAL_COMMUNITY): Payer: Self-pay | Admitting: Emergency Medicine

## 2012-07-03 DIAGNOSIS — Z8673 Personal history of transient ischemic attack (TIA), and cerebral infarction without residual deficits: Secondary | ICD-10-CM | POA: Insufficient documentation

## 2012-07-03 DIAGNOSIS — Z8611 Personal history of tuberculosis: Secondary | ICD-10-CM | POA: Insufficient documentation

## 2012-07-03 DIAGNOSIS — Z7982 Long term (current) use of aspirin: Secondary | ICD-10-CM | POA: Insufficient documentation

## 2012-07-03 DIAGNOSIS — R011 Cardiac murmur, unspecified: Secondary | ICD-10-CM | POA: Insufficient documentation

## 2012-07-03 DIAGNOSIS — I10 Essential (primary) hypertension: Secondary | ICD-10-CM | POA: Insufficient documentation

## 2012-07-03 DIAGNOSIS — R040 Epistaxis: Secondary | ICD-10-CM | POA: Insufficient documentation

## 2012-07-03 DIAGNOSIS — Z8679 Personal history of other diseases of the circulatory system: Secondary | ICD-10-CM | POA: Insufficient documentation

## 2012-07-03 DIAGNOSIS — Z79899 Other long term (current) drug therapy: Secondary | ICD-10-CM | POA: Insufficient documentation

## 2012-07-03 DIAGNOSIS — I2510 Atherosclerotic heart disease of native coronary artery without angina pectoris: Secondary | ICD-10-CM | POA: Insufficient documentation

## 2012-07-03 DIAGNOSIS — I4891 Unspecified atrial fibrillation: Secondary | ICD-10-CM | POA: Insufficient documentation

## 2012-07-03 DIAGNOSIS — Z7901 Long term (current) use of anticoagulants: Secondary | ICD-10-CM | POA: Insufficient documentation

## 2012-07-03 DIAGNOSIS — Z8701 Personal history of pneumonia (recurrent): Secondary | ICD-10-CM | POA: Insufficient documentation

## 2012-07-03 HISTORY — DX: Epistaxis: R04.0

## 2012-07-03 LAB — POCT I-STAT, CHEM 8
BUN: 21 mg/dL (ref 6–23)
Chloride: 107 mEq/L (ref 96–112)
Creatinine, Ser: 0.9 mg/dL (ref 0.50–1.10)
Glucose, Bld: 98 mg/dL (ref 70–99)
Potassium: 4 mEq/L (ref 3.5–5.1)

## 2012-07-03 LAB — CBC
MCH: 27.6 pg (ref 26.0–34.0)
MCHC: 31.6 g/dL (ref 30.0–36.0)
MCV: 87.3 fL (ref 78.0–100.0)
Platelets: 207 10*3/uL (ref 150–400)
RDW: 21.2 % — ABNORMAL HIGH (ref 11.5–15.5)

## 2012-07-03 LAB — PROTIME-INR: Prothrombin Time: 24.1 seconds — ABNORMAL HIGH (ref 11.6–15.2)

## 2012-07-03 MED ORDER — COCAINE HCL 4 % EX SOLN
4.0000 mL | Freq: Once | CUTANEOUS | Status: DC
  Filled 2012-07-03: qty 4

## 2012-07-03 MED ORDER — PHENYLEPHRINE HCL 0.5 % NA SOLN
1.0000 [drp] | Freq: Once | NASAL | Status: AC
  Administered 2012-07-03: 1 [drp] via NASAL
  Filled 2012-07-03: qty 15

## 2012-07-03 NOTE — ED Notes (Signed)
Pt began bleeding heavily again. Notified Dr. Orson Slick. Educated pt to continue to hold pressure on her nose. Provided comfort measures.

## 2012-07-03 NOTE — ED Notes (Signed)
Pt presents with moderate nose bleed. Pt is on coumadin. Pt states the nose bleed started about 1830, however improved, and worsened after 30- minutes and came to ED. Pt reports that she had a nosebleed yesterday and was seen at [facility] and was cauterized and instructed to continue Coumadin therapy. Pt had three prior cauterizations. Pt holding firm pressure on nasal septum which is slowing nose bleed.

## 2012-07-03 NOTE — ED Provider Notes (Signed)
History     CSN: [id number]  Arrival date & time 07/03/12  2008   First MD Initiated Contact with Patient 07/03/12 2030      Chief Complaint  Patient presents with  . Epistaxis   HPI  History provided by the patient. Patient is a 72 year old female with history of hypertension, heart valve replacement, prior A. fib currently on Coumadin who presents with complaints of right nosebleed. Patient reports having similar symptoms yesterday and seen her ENT specialist, Dr. Lazarus Salines in his office and had cauterization performed. This evening patient reports having a return of the right-sided nosebleed around 6:30. Bleeding has been steady and constant. Patient has attempted to hold pressure for long periods of time without improvement. She has had bleeding into the back the throat as well with some spitting up blood. Blood also sometimes comes around through the left nose as well. Patient denies any other associated symptoms. Denies any lightheadedness, near-syncope or shortness of breath.    Past Medical History  Diagnosis Date  . HTN (hypertension)   . Atrial fibrillation   . TIA (transient ischemic attack)   . Tuberculosis   . Aortic dilatation   . Pneumonia   . Coronary artery disease   . Epistaxis, recurrent     Past Surgical History  Procedure Laterality Date  . Cesarean section    . 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 CVA Father 73    Died 36  . Colon cancer Neg Hx     History  Substance Use Topics  . Smoking status: Never Smoker   . Smokeless tobacco: Never Used  . Alcohol Use: No    OB History   Grav Para Term Preterm Abortions TAB SAB Ect Mult Living                  Review of Systems  Constitutional: Negative for fever and chills.  Respiratory: Negative for  cough.   All other systems reviewed and are negative.    Allergies  Milk-related compounds; Monosodium glutamate; and Penicillins  Home Medications   Current Outpatient Rx  Name  Route  Sig  Dispense  Refill  . amiodarone (PACERONE) 200 MG tablet   Oral   Take 200 mg by mouth daily.          Marland Kitchen aspirin 81 MG tablet   Oral   Take 81 mg by mouth daily.         . cholecalciferol (VITAMIN D) 1000 UNITS tablet   Oral   Take 1,000 Units by mouth daily. Stopped taking over the counter Vitamin D 6 months ago. Waiting to pick up prescription Vitamin D for the higher, once weekly dose.         . Cholecalciferol (VITAMIN D3) 50000 UNITS CAPS   Oral   Take 1 capsule by mouth once a week. Take one 50,000 unit capsule by mouth every 7 days for 6 weeks, then return to 1000 units daily.   6 capsule   0   . ferrous sulfate 325 (65 FE) MG EC tablet   Oral   Take 325 mg by mouth daily.         Marland Kitchen latanoprost (XALATAN) 0.005 % ophthalmic solution   Both Eyes   Place  1 drop into both eyes at bedtime.          . simethicone (MYLICON) 40 MG/0.6ML drops   Oral   Take 0.6 mLs (40 mg total) by mouth 4 (four) times daily as needed.   30 mL   0   . warfarin (COUMADIN) 2.5 MG tablet      daily. Takes 1mg  on Sunday, Tuesday, and Thursday Takes 2.5mg  on Monday, Wednesday, Friday and Saturday           BP 172/96  Pulse 81  Temp(Src) 98.1 F (36.7 C) (Oral)  Resp 20  SpO2 97%  Physical Exam  Nursing note and vitals reviewed. Constitutional: She is oriented to person, place, and time. She appears well-developed and well-nourished. No distress.  HENT:  Head: Normocephalic.  Nose: Epistaxis is observed.  Mouth/Throat: Oropharynx is clear and moist.  Right-sided epistaxis.  Cardiovascular: Normal rate and regular rhythm.   Murmur heard. Pulmonary/Chest: Effort normal and breath sounds normal. No stridor.  Abdominal: Soft.  Musculoskeletal: Normal range of motion.   Neurological: She is alert and oriented to person, place, and time.  Skin: Skin is warm and dry. No rash noted.  Psychiatric: She has a normal mood and affect. Her behavior is normal.    ED Course  EPISTAXIS MANAGEMENT Date/Time: 07/03/2012 9:30 PM Performed by: Angus Seller Authorized by: Ivonne Andrew S Risks and benefits: risks, benefits and alternatives were discussed Consent given by: patient Patient understanding: patient states understanding of the procedure being performed Patient consent: the patient's understanding of the procedure matches consent given Procedure consent: procedure consent matches procedure scheduled Patient identity confirmed: verbally with patient Time out: Immediately prior to procedure a "time out" was called to verify the correct patient, procedure, equipment, support staff and site/side marked as required. Treatment site: right posterior Repair method: nasal balloon Post-procedure assessment: bleeding decreased Treatment complexity: complex Recurrence: recurrence of recent bleed Patient tolerance: Patient tolerated the procedure well with no immediate complications.     Results for orders placed during the hospital encounter of 07/03/12  CBC      Result Value Range   WBC 5.1  4.0 - 10.5 K/uL   RBC 3.77 (*) 3.87 - 5.11 MIL/uL   Hemoglobin 10.4 (*) 12.0 - 15.0 g/dL   HCT 16.1 (*) 09.6 - 04.5 %   MCV 87.3  78.0 - 100.0 fL   MCH 27.6  26.0 - 34.0 pg   MCHC 31.6  30.0 - 36.0 g/dL   RDW 40.9 (*) 81.1 - 91.4 %   Platelets 207  150 - 400 K/uL  PROTIME-INR      Result Value Range   Prothrombin Time 24.1 (*) 11.6 - 15.2 seconds   INR 2.28 (*) 0.00 - 1.49  POCT I-STAT, CHEM 8      Result Value Range   Sodium 143  135 - 145 mEq/L   Potassium 4.0  3.5 - 5.1 mEq/L   Chloride 107  96 - 112 mEq/L   BUN 21  6 - 23 mg/dL   Creatinine, Ser 7.82  0.50 - 1.10 mg/dL   Glucose, Bld 98  70 - 99 mg/dL   Calcium, Ion 9.56  2.13 - 1.30 mmol/L   TCO2 28  0 -  100 mmol/L   Hemoglobin 11.6 (*) 12.0 - 15.0 g/dL   HCT 08.6 (*) 57.8 - 46.9 %        1. Anterior epistaxis       MDM  8:35 PM  patient seen and evaluated. Patient with active right-sided epistaxis appears mildly uncomfortable.  Patient seen and discussed with attending physician. Patient continues to have bleeding. INR therapeutic at this time. [facility] consult ENT.  Spoke with Dr. Lazarus Salines with ENT. He is familiar with patient at about to begin the procedure at [facility] cone operating room. He asked that we continue to attempt to try to stop bleeding and call back if unsuccessful.  Additional attempts to stop bleeding unsuccessful. Patient has asked that we do not attempt any other treatments to her nose as she is having discomfort from multiple packing attempts.  Plan to consult ENT again.  Spoke with Dr. Lazarus Salines. He will come and see patient as soon as he finishes procedure. He is requesting supplies brought to the bedside including ENT tray to wall suction, 4% cocaine 4 ML's, nasal endoscope.  Dr. Lazarus Salines has seen and treated patient and she is stable for discharge at this time with control of bleeding  Angus Seller, [place] 07/04/12 [phone]

## 2012-07-04 ENCOUNTER — Encounter: Payer: Self-pay | Admitting: Internal Medicine

## 2012-07-04 NOTE — ED Notes (Signed)
ENT MD at bedside.

## 2012-07-04 NOTE — ED Provider Notes (Signed)
Medical screening examination/treatment/procedure(s) were conducted as a shared visit with non-physician practitioner(s) and myself.  I personally evaluated the patient during the encounter Pt w hx recurrent epistaxis, on coumadin, c/o nosebleed esp right. Unable to visualize bleeding site. Cotton ball impregnated w afrin, pressure, unable to visualize bleeding site. Ant packing. ent called.   Suzi Roots, MD 07/04/12 [phone]

## 2012-07-04 NOTE — Consult Note (Signed)
[name], Prentiss 72 y.o., female [id number]     Chief Complaint:   RIGHT epistaxis  HPI:   72 yo wf known to me with recurrent epistaxis.  Ant septal silver nitrate cautery just yesterday, for visualized minor oozing.  Today, onset brisk bleeding.  No success with control by ER physicians including balloon and sponge packing.  Pt with 3 artificial heart valves.  On Coumadin and ASA.  INR OK.    PMH: Past Medical History  Diagnosis Date  . HTN (hypertension)   . Atrial fibrillation   . TIA (transient ischemic attack)   . Tuberculosis   . Aortic dilatation   . Pneumonia   . Coronary artery disease   . Epistaxis, recurrent     Surg Hx: Past Surgical History  Procedure Laterality Date  . Cesarean section    . Cardiac surgery       Bentalll 25  mm Freestyle porcine aortic valve, mitral repair 26 mm reein, tricuspid repair 25 mm rein, biatrial maze, ligation of left atrial appendage 12/26/2011    FHx:   Family History  Problem Relation Age of Onset  . CVA Father 49    Died 73  . Colon cancer Neg Hx    SocHx:  reports that she has never smoked. She has never used smokeless tobacco. She reports that she does not drink alcohol or use illicit drugs.  ALLERGIES:  Allergies  Allergen Reactions  . Milk-Related Compounds   . Monosodium Glutamate     Headache, dizziness  . Penicillins     "simply doesn't work for me"      (Not in a hospital admission)  Results for orders placed during the hospital encounter of 07/03/12 (from the past 48 hour(s))  CBC     Status: Abnormal   Collection Time    07/03/12  8:34 PM      Result Value Range   WBC 5.1  4.0 - 10.5 K/uL   RBC 3.77 (*) 3.87 - 5.11 MIL/uL   Hemoglobin 10.4 (*) 12.0 - 15.0 g/dL   HCT 86.5 (*) 78.4 - 69.6 %   MCV 87.3  78.0 - 100.0 fL   MCH 27.6  26.0 - 34.0 pg   MCHC 31.6  30.0 - 36.0 g/dL   RDW 29.5 (*) 28.4 - 13.2 %   Platelets 207  150 - 400 K/uL  PROTIME-INR     Status: Abnormal   Collection Time   07/03/12  8:34 PM      Result Value Range   Prothrombin Time 24.1 (*) 11.6 - 15.2 seconds   INR 2.28 (*) 0.00 - 1.49  POCT I-STAT, CHEM 8     Status: Abnormal   Collection Time    07/03/12  9:13 PM      Result Value Range   Sodium 143  135 - 145 mEq/L   Potassium 4.0  3.5 - 5.1 mEq/L   Chloride 107  96 - 112 mEq/L   BUN 21  6 - 23 mg/dL   Creatinine, Ser 4.40  0.50 - 1.10 mg/dL   Glucose, Bld 98  70 - 99 mg/dL   Calcium, Ion 1.02  7.25 - 1.30 mmol/L   TCO2 28  0 - 100 mmol/L   Hemoglobin 11.6 (*) 12.0 - 15.0 g/dL   HCT 36.6 (*) 44.0 - 34.7 %   No results found.    Blood pressure 155/77, pulse 81, temperature 98.1 F (36.7 C), temperature source Oral, resp. rate 21, SpO2 96.00%.  PHYSICAL EXAM: Fatigued, distressed.  Short RIGHT anterior balloon pack in place.  Upon removal, brisk RIGHT anterior epistaxis.  Arteriolar bleeding noted at low anterior RIGHT septum.  LEFT septum with slight old blood soiling but no active bleeding.  Old clots in pharynx, but no active bleeding.      Assessment/Plan Coagulopathic RIGHT anterior septal epistaxis, arteriolar.   4% Cocaine topical anesthesia attempted with poor success owing to active bleeding.    Silver nitrate cautery accomplished with eventual decent control, and good tolerance by patient.    Will continue with liberal nasal hygiene measures, Afrin spray for vasoconstriction for 2 days.  Limited activity for 5-7 days.  Recheck my office 3-4 weeks if doing well.    Flo Shanks 07/04/2012, 12:42 AM

## 2012-07-06 ENCOUNTER — Encounter: Payer: Self-pay | Admitting: Internal Medicine

## 2012-07-07 ENCOUNTER — Encounter: Payer: Self-pay | Admitting: Internal Medicine

## 2012-07-13 ENCOUNTER — Encounter: Payer: Self-pay | Admitting: *Deleted

## 2012-07-14 ENCOUNTER — Encounter (HOSPITAL_COMMUNITY)
Admission: EM | Disposition: A | Discharge status: Home / Self Care | Payer: Self-pay | Source: Home / Self Care | Attending: Otolaryngology

## 2012-07-14 ENCOUNTER — Encounter (HOSPITAL_COMMUNITY): Payer: Self-pay | Admitting: Emergency Medicine

## 2012-07-14 ENCOUNTER — Inpatient Hospital Stay (HOSPITAL_COMMUNITY)
Admission: EM | Admit: 2012-07-14 | Discharge: 2012-07-16 | DRG: 063 | Disposition: A | Discharge status: Home / Self Care | Payer: BC Managed Care – PPO | Attending: Otolaryngology | Admitting: Otolaryngology

## 2012-07-14 ENCOUNTER — Encounter (HOSPITAL_COMMUNITY): Payer: Self-pay | Admitting: Anesthesiology

## 2012-07-14 ENCOUNTER — Observation Stay (HOSPITAL_COMMUNITY): Payer: BC Managed Care – PPO | Admitting: Anesthesiology

## 2012-07-14 DIAGNOSIS — I10 Essential (primary) hypertension: Secondary | ICD-10-CM | POA: Diagnosis present

## 2012-07-14 DIAGNOSIS — Z8673 Personal history of transient ischemic attack (TIA), and cerebral infarction without residual deficits: Secondary | ICD-10-CM

## 2012-07-14 DIAGNOSIS — T45515A Adverse effect of anticoagulants, initial encounter: Secondary | ICD-10-CM | POA: Diagnosis present

## 2012-07-14 DIAGNOSIS — T39095A Adverse effect of salicylates, initial encounter: Secondary | ICD-10-CM | POA: Diagnosis present

## 2012-07-14 DIAGNOSIS — Z880 Allergy status to penicillin: Secondary | ICD-10-CM

## 2012-07-14 DIAGNOSIS — D62 Acute posthemorrhagic anemia: Secondary | ICD-10-CM | POA: Diagnosis present

## 2012-07-14 DIAGNOSIS — Z954 Presence of other heart-valve replacement: Secondary | ICD-10-CM

## 2012-07-14 DIAGNOSIS — Z8611 Personal history of tuberculosis: Secondary | ICD-10-CM

## 2012-07-14 DIAGNOSIS — I2510 Atherosclerotic heart disease of native coronary artery without angina pectoris: Secondary | ICD-10-CM | POA: Diagnosis present

## 2012-07-14 DIAGNOSIS — Y92009 Unspecified place in unspecified non-institutional (private) residence as the place of occurrence of the external cause: Secondary | ICD-10-CM

## 2012-07-14 DIAGNOSIS — Z79899 Other long term (current) drug therapy: Secondary | ICD-10-CM

## 2012-07-14 DIAGNOSIS — I4891 Unspecified atrial fibrillation: Secondary | ICD-10-CM | POA: Diagnosis present

## 2012-07-14 DIAGNOSIS — Z888 Allergy status to other drugs, medicaments and biological substances status: Secondary | ICD-10-CM

## 2012-07-14 DIAGNOSIS — R040 Epistaxis: Principal | ICD-10-CM | POA: Diagnosis present

## 2012-07-14 HISTORY — PX: NASAL ENDOSCOPY WITH EPISTAXIS CONTROL: SHX5664

## 2012-07-14 LAB — CBC WITH DIFFERENTIAL/PLATELET
Basophils Relative: 0 % (ref 0–1)
Eosinophils Absolute: 0.2 10*3/uL (ref 0.0–0.7)
HCT: 29.1 % — ABNORMAL LOW (ref 36.0–46.0)
Hemoglobin: 9.5 g/dL — ABNORMAL LOW (ref 12.0–15.0)
MCH: 28.4 pg (ref 26.0–34.0)
MCHC: 32.6 g/dL (ref 30.0–36.0)
Monocytes Absolute: 0.4 10*3/uL (ref 0.1–1.0)
Monocytes Relative: 10 % (ref 3–12)
Neutro Abs: 2.1 10*3/uL (ref 1.7–7.7)

## 2012-07-14 LAB — PREPARE RBC (CROSSMATCH)

## 2012-07-14 SURGERY — CONTROL OF EPISTAXIS, ENDOSCOPIC
Anesthesia: General | Site: Nose | Wound class: Clean Contaminated

## 2012-07-14 MED ORDER — 0.9 % SODIUM CHLORIDE (POUR BTL) OPTIME
TOPICAL | Status: DC | PRN
  Administered 2012-07-14: 1000 mL

## 2012-07-14 MED ORDER — DEXTROSE-NACL 5-0.9 % IV SOLN
INTRAVENOUS | Status: DC
  Administered 2012-07-14: 10:00:00 via INTRAVENOUS
  Administered 2012-07-15: 75 mL/h via INTRAVENOUS
  Administered 2012-07-16: 08:00:00 via INTRAVENOUS

## 2012-07-14 MED ORDER — CLINDAMYCIN PHOSPHATE 600 MG/50ML IV SOLN
600.0000 mg | Freq: Two times a day (BID) | INTRAVENOUS | Status: DC
  Administered 2012-07-14 – 2012-07-16 (×5): 600 mg via INTRAVENOUS
  Filled 2012-07-14 (×5): qty 50

## 2012-07-14 MED ORDER — LIDOCAINE-EPINEPHRINE 1 %-1:100000 IJ SOLN
INTRAMUSCULAR | Status: DC | PRN
  Administered 2012-07-14: 8 mL

## 2012-07-14 MED ORDER — HYDRALAZINE HCL 20 MG/ML IJ SOLN
10.0000 mg | Freq: Three times a day (TID) | INTRAMUSCULAR | Status: DC | PRN
  Filled 2012-07-14: qty 0.5

## 2012-07-14 MED ORDER — WARFARIN SODIUM 1 MG PO TABS
1.0000 mg | ORAL_TABLET | ORAL | Status: DC
  Filled 2012-07-14: qty 1

## 2012-07-14 MED ORDER — WARFARIN SODIUM 1 MG PO TABS
1.0000 mg | ORAL_TABLET | Freq: Every day | ORAL | Status: DC
  Filled 2012-07-14: qty 1

## 2012-07-14 MED ORDER — VITAMIN B-12 100 MCG PO TABS
50.0000 ug | ORAL_TABLET | Freq: Every day | ORAL | Status: DC
  Administered 2012-07-15 – 2012-07-16 (×2): 50 ug via ORAL
  Filled 2012-07-14 (×3): qty 1

## 2012-07-14 MED ORDER — ASPIRIN 81 MG PO CHEW
81.0000 mg | CHEWABLE_TABLET | Freq: Every day | ORAL | Status: DC
  Filled 2012-07-14 (×3): qty 1

## 2012-07-14 MED ORDER — SUCCINYLCHOLINE CHLORIDE 20 MG/ML IJ SOLN
INTRAMUSCULAR | Status: DC | PRN
  Administered 2012-07-14: 100 mg via INTRAVENOUS

## 2012-07-14 MED ORDER — PROPOFOL 10 MG/ML IV BOLUS
INTRAVENOUS | Status: DC | PRN
  Administered 2012-07-14: 80 mg via INTRAVENOUS
  Administered 2012-07-14: 20 mg via INTRAVENOUS

## 2012-07-14 MED ORDER — PHENYLEPHRINE HCL 10 MG/ML IJ SOLN
10.0000 mg | INTRAVENOUS | Status: DC | PRN
  Administered 2012-07-14: 80 ug/min via INTRAVENOUS

## 2012-07-14 MED ORDER — PHENYLEPHRINE HCL 10 MG/ML IJ SOLN: 10.0000 mg | INTRAVENOUS | Status: DC | PRN

## 2012-07-14 MED ORDER — ACETAMINOPHEN 325 MG PO TABS
325.0000 mg | ORAL_TABLET | ORAL | Status: DC | PRN
  Administered 2012-07-16: 325 mg via ORAL
  Filled 2012-07-14: qty 2

## 2012-07-14 MED ORDER — SODIUM CHLORIDE 0.9 % IV SOLN
INTRAVENOUS | Status: DC | PRN
  Administered 2012-07-14 (×2): via INTRAVENOUS

## 2012-07-14 MED ORDER — HYDROMORPHONE HCL PF 1 MG/ML IJ SOLN
INTRAMUSCULAR | Status: AC
  Filled 2012-07-14: qty 1

## 2012-07-14 MED ORDER — METOCLOPRAMIDE HCL 5 MG/ML IJ SOLN
INTRAMUSCULAR | Status: DC | PRN
  Administered 2012-07-14: 10 mg via INTRAVENOUS

## 2012-07-14 MED ORDER — HYDROMORPHONE HCL PF 1 MG/ML IJ SOLN
0.2500 mg | INTRAMUSCULAR | Status: DC | PRN
  Administered 2012-07-14: 0.25 mg via INTRAVENOUS

## 2012-07-14 MED ORDER — ACETAMINOPHEN 80 MG PO CHEW
320.0000 mg | CHEWABLE_TABLET | ORAL | Status: DC | PRN
  Filled 2012-07-14: qty 4

## 2012-07-14 MED ORDER — WARFARIN - PHYSICIAN DOSING INPATIENT: Freq: Every day | Status: DC

## 2012-07-14 MED ORDER — ONDANSETRON HCL 4 MG/2ML IJ SOLN
INTRAMUSCULAR | Status: DC | PRN
  Administered 2012-07-14: 4 mg via INTRAVENOUS

## 2012-07-14 MED ORDER — BACITRACIN ZINC 500 UNIT/GM EX OINT
TOPICAL_OINTMENT | CUTANEOUS | Status: AC
  Filled 2012-07-14: qty 15

## 2012-07-14 MED ORDER — SIMETHICONE 40 MG/0.6ML PO SUSP
40.0000 mg | Freq: Four times a day (QID) | ORAL | Status: DC | PRN
  Filled 2012-07-14: qty 0.6

## 2012-07-14 MED ORDER — AMIODARONE HCL 200 MG PO TABS
200.0000 mg | ORAL_TABLET | Freq: Every day | ORAL | Status: DC
  Administered 2012-07-14 – 2012-07-16 (×3): 200 mg via ORAL
  Filled 2012-07-14 (×3): qty 1

## 2012-07-14 MED ORDER — OXYMETAZOLINE HCL 0.05 % NA SOLN
NASAL | Status: AC
  Filled 2012-07-14: qty 15

## 2012-07-14 MED ORDER — LATANOPROST 0.005 % OP SOLN
1.0000 [drp] | Freq: Every day | OPHTHALMIC | Status: DC
  Administered 2012-07-14 – 2012-07-15 (×2): 1 [drp] via OPHTHALMIC
  Filled 2012-07-14: qty 2.5

## 2012-07-14 MED ORDER — OXYMETAZOLINE HCL 0.05 % NA SOLN
2.0000 | Freq: Three times a day (TID) | NASAL | Status: DC
  Administered 2012-07-14 – 2012-07-15 (×4): 2 via NASAL
  Filled 2012-07-14: qty 15

## 2012-07-14 MED ORDER — MORPHINE SULFATE 2 MG/ML IJ SOLN: 1.0000 mg | INTRAMUSCULAR | Status: DC | PRN

## 2012-07-14 MED ORDER — HYDROCODONE-ACETAMINOPHEN 5-325 MG PO TABS
1.0000 | ORAL_TABLET | ORAL | Status: DC | PRN
  Administered 2012-07-14 – 2012-07-16 (×5): 1 via ORAL
  Filled 2012-07-14 (×5): qty 1

## 2012-07-14 MED ORDER — WARFARIN SODIUM 2.5 MG PO TABS
2.5000 mg | ORAL_TABLET | ORAL | Status: DC
  Filled 2012-07-14: qty 1

## 2012-07-14 MED ORDER — BACITRACIN ZINC 500 UNIT/GM EX OINT
TOPICAL_OINTMENT | CUTANEOUS | Status: DC | PRN
  Administered 2012-07-14: 1 via TOPICAL

## 2012-07-14 MED ORDER — LIDOCAINE-EPINEPHRINE 1 %-1:100000 IJ SOLN
INTRAMUSCULAR | Status: AC
  Filled 2012-07-14: qty 1

## 2012-07-14 MED ORDER — OXYMETAZOLINE HCL 0.05 % NA SOLN: 1.0000 | Freq: Once | NASAL | Status: DC

## 2012-07-14 MED ORDER — LIDOCAINE HCL (CARDIAC) 20 MG/ML IV SOLN
INTRAVENOUS | Status: DC | PRN
  Administered 2012-07-14: 30 mg via INTRAVENOUS

## 2012-07-14 MED ORDER — FERROUS SULFATE 325 (65 FE) MG PO TABS
325.0000 mg | ORAL_TABLET | Freq: Every day | ORAL | Status: DC
  Administered 2012-07-15 – 2012-07-16 (×2): 325 mg via ORAL
  Filled 2012-07-14 (×3): qty 1

## 2012-07-14 MED ORDER — CHLORHEXIDINE GLUCONATE 0.12 % MT SOLN
15.0000 mL | Freq: Two times a day (BID) | OROMUCOSAL | Status: DC
  Administered 2012-07-14 – 2012-07-15 (×3): 15 mL via OROMUCOSAL
  Filled 2012-07-14 (×7): qty 15

## 2012-07-14 MED ORDER — SODIUM CHLORIDE 0.9 % IV BOLUS (SEPSIS)
1000.0000 mL | Freq: Once | INTRAVENOUS | Status: AC
  Administered 2012-07-14: 1000 mL via INTRAVENOUS

## 2012-07-14 MED ORDER — OXYMETAZOLINE HCL 0.05 % NA SOLN
NASAL | Status: DC | PRN
  Administered 2012-07-14: 1

## 2012-07-14 MED ORDER — ONDANSETRON HCL 4 MG/2ML IJ SOLN: 4.0000 mg | Freq: Four times a day (QID) | INTRAMUSCULAR | Status: DC | PRN

## 2012-07-14 MED ORDER — BIOTENE DRY MOUTH MT LIQD
15.0000 mL | Freq: Two times a day (BID) | OROMUCOSAL | Status: DC
  Administered 2012-07-15 – 2012-07-16 (×2): 15 mL via OROMUCOSAL

## 2012-07-14 MED ORDER — VITAMIN D (ERGOCALCIFEROL) 1.25 MG (50000 UNIT) PO CAPS
50000.0000 [IU] | ORAL_CAPSULE | ORAL | Status: DC
  Administered 2012-07-15: 50000 [IU] via ORAL
  Filled 2012-07-14: qty 1

## 2012-07-14 MED ORDER — PHENOL 1.4 % MT LIQD
2.0000 | Freq: Three times a day (TID) | OROMUCOSAL | Status: DC | PRN
  Filled 2012-07-14: qty 177

## 2012-07-14 MED ORDER — ONDANSETRON HCL 8 MG PO TABS
4.0000 mg | ORAL_TABLET | Freq: Four times a day (QID) | ORAL | Status: DC | PRN
  Filled 2012-07-14: qty 0.5

## 2012-07-14 MED ORDER — SODIUM CHLORIDE 0.9 % IJ SOLN
OROMUCOSAL | Status: DC | PRN
  Administered 2012-07-14: 05:00:00 via TOPICAL

## 2012-07-14 SURGICAL SUPPLY — 50 items
APL SKNCLS STERI-STRIP NONHPOA (GAUZE/BANDAGES/DRESSINGS) ×1
ATTRACTOMAT 16X20 MAGNETIC DRP (DRAPES) IMPLANT
BENZOIN TINCTURE PRP APPL 2/3 (GAUZE/BANDAGES/DRESSINGS) ×2 IMPLANT
BLADE RAD40 ROTATE 4M 4 5PK (BLADE) IMPLANT
BLADE RAD60 ROTATE M4 4 5PK (BLADE) IMPLANT
BLADE TRICUT ROTATE M4 4 5PK (BLADE) IMPLANT
CANISTER SUCTION 2500CC (MISCELLANEOUS) ×2 IMPLANT
CLOTH BEACON ORANGE TIMEOUT ST (SAFETY) ×2 IMPLANT
COAGULATOR SUCT SWTCH 10FR 6 (ELECTROSURGICAL) ×2 IMPLANT
CORDS BIPOLAR (ELECTRODE) IMPLANT
CRADLE DONUT ADULT HEAD (MISCELLANEOUS) ×2 IMPLANT
DECANTER SPIKE VIAL GLASS SM (MISCELLANEOUS) ×2 IMPLANT
DRAPE CAMERA CLOSED 9X96 (DRAPES) ×2 IMPLANT
DRESSING NASAL POPE 10X1.5X2.5 (GAUZE/BANDAGES/DRESSINGS) ×3 IMPLANT
DRESSING TELFA 8X3 (GAUZE/BANDAGES/DRESSINGS) IMPLANT
DRSG NASAL POPE 10X1.5X2.5 (GAUZE/BANDAGES/DRESSINGS) ×6
ELECT REM PT RETURN 9FT ADLT (ELECTROSURGICAL) ×2
ELECTRODE REM PT RTRN 9FT ADLT (ELECTROSURGICAL) ×1 IMPLANT
FILTER ARTHROSCOPY CONVERTOR (FILTER) IMPLANT
FLOSEAL 10ML (HEMOSTASIS) IMPLANT
GLOVE BIOGEL PI IND STRL 7.0 (GLOVE) ×1 IMPLANT
GLOVE BIOGEL PI INDICATOR 7.0 (GLOVE) ×1
GLOVE ECLIPSE 7.5 STRL STRAW (GLOVE) ×2 IMPLANT
GLOVE SURG SS PI 7.5 STRL IVOR (GLOVE) ×2 IMPLANT
GOWN STRL NON-REIN LRG LVL3 (GOWN DISPOSABLE) ×4 IMPLANT
HEMOSTAT POWDER KIT SURGIFOAM (HEMOSTASIS) ×2 IMPLANT
KIT BASIN OR (CUSTOM PROCEDURE TRAY) ×2 IMPLANT
KIT ROOM TURNOVER OR (KITS) ×2 IMPLANT
NEEDLE SPNL 25GX3.5 QUINCKE BL (NEEDLE) ×2 IMPLANT
NS IRRIG 1000ML POUR BTL (IV SOLUTION) ×2 IMPLANT
PAD ARMBOARD 7.5X6 YLW CONV (MISCELLANEOUS) ×2 IMPLANT
PATTIES SURGICAL .5 X3 (DISPOSABLE) IMPLANT
PENCIL FOOT CONTROL (ELECTRODE) IMPLANT
SHEATH ENDOSCRUB 0 DEG (SHEATH) IMPLANT
SHEATH ENDOSCRUB 30 DEG (SHEATH) IMPLANT
SHEATH ENDOSCRUB 45 DEG (SHEATH) IMPLANT
SOLUTION ANTI FOG 6CC (MISCELLANEOUS) ×2 IMPLANT
SPECIMEN JAR SMALL (MISCELLANEOUS) IMPLANT
SPONGE NEURO XRAY DETECT 1X3 (DISPOSABLE) ×2 IMPLANT
STRIP CLOSURE SKIN 1/2X4 (GAUZE/BANDAGES/DRESSINGS) ×2 IMPLANT
SUT ETHILON 3 0 PS 1 (SUTURE) IMPLANT
SWAB COLLECTION DEVICE MRSA (MISCELLANEOUS) IMPLANT
SYR 50ML SLIP (SYRINGE) IMPLANT
SYRINGE 10CC LL (SYRINGE) IMPLANT
TOWEL OR 17X24 6PK STRL BLUE (TOWEL DISPOSABLE) IMPLANT
TOWEL OR 17X26 10 PK STRL BLUE (TOWEL DISPOSABLE) ×2 IMPLANT
TRAY ENT MC OR (CUSTOM PROCEDURE TRAY) ×2 IMPLANT
TUBE CONNECTING 12X1/4 (SUCTIONS) IMPLANT
WATER STERILE IRR 1000ML POUR (IV SOLUTION) IMPLANT
WIPE INSTRUMENT VISIWIPE 73X73 (MISCELLANEOUS) IMPLANT

## 2012-07-14 NOTE — Op Note (Signed)
DATE OF OPERATION: 07/14/2012 Surgeon: Melvenia Beam Procedure Performed: bilateral nasal endoscopy with control of epistaxis and endoscopic ligation/cauterization of bilateral sphenopalatine artery 31238-50  PREOPERATIVE DIAGNOSIS: acute severe epistaxis POSTOPERATIVE DIAGNOSIS: acute severe epistaxis  SURGEON: Melvenia Beam ANESTHESIA: General endotracheal.  ESTIMATED BLOOD LOSS: Approximately 200 mL.  DRAINS/DRESSINGS: bilateral 10cm meroceles, 2 on right, 1 on left SPECIMENS: none INDICATIONS: The patient is a 71yo with a history of recurrent and acute severe epistaxis on Coumadin and aspirin DESCRIPTION OF OPERATION: The patient was brought to the operating room and was placed in the supine position and was placed under general endotracheal anesthesia by anesthesiology. The patient's nose was inspected and suctioned of ~184mL of fresh blood and clot and irrigated out and suctioned, and then the nose was decongested with Afrin-coated pledgets which were then removed. The patient was draped in the usual sterile fashion. After the Afrin pledgets were removed, the inferior turbinates were outfractured. The bilateral greater palatine and sphenopalatine foramina were injected with 1% lidocaine with epinephrine.  I first began on the left. The middle turbinate was deflected medially and the crista ethmoidalis was identified. The Cottle was used to elevate the mucosa off of the crista ethmoidalis and the sphenopalatine foramen was identified with the sphenopalatine artery coursing out of it. The Bovie suction cautery was used to ligate and cauterize the left sphenopalatine artery with god hemostasis noted.   Next we turned to the right side. There was copious, brisk arterial bleeding coming from the right anterior septum that was thoroughly cauterized with the suction Bovie until hemostasis was noted. Next the right middle turbinate was deflected medially and the crista ethmoidalis was identified. The  Cottle was used to elevate the mucosa off of the crista ethmoidalis and the sphenopalatine foramen was identified with the sphenopalatine artery coursing out of it. The Bovie suction cautery was used to ligate and cauterize the right sphenopalatine artery. Once hemostasis was noted the nasal cavity was irrigated out and suctioned. Floseal was placed in the bilateral sphenopalatine and middle meatal areas and nasal cavity bilaterally, and then 2 side by side 10cm meroceles coated in bacitracin were placed on teh right, and one 10cm merocele was placed on the left. The strings were secured to the patient's face with benzoin/steristrips. The stomach was suctioned out and the patient was turned back to anesthesia and awakened from anesthesia and extubated without difficulty. The patient tolerated the procedure well with no immediate complications and was taken to the postoperative recovery area in good condition.   Dr. Melvenia Beam was present and performed the entire procedure. 07/14/2012  5:48 AM Melvenia Beam

## 2012-07-14 NOTE — Transfer of Care (Signed)
Immediate Anesthesia Transfer of Care Note  Patient: [name]  Procedure(s) Performed: Procedure(s): NASAL ENDOSCOPY WITH EPISTAXIS CONTROL (N/A)  Patient Location: PACU  Anesthesia Type:General  Level of Consciousness: awake  Airway & Oxygen Therapy: Patient Spontanous Breathing and Patient connected to face mask oxygen  Post-op Assessment: Report given to PACU RN and Post -op Vital signs reviewed and stable  Post vital signs: Reviewed and stable  Complications: No apparent anesthesia complications

## 2012-07-14 NOTE — Anesthesia Postprocedure Evaluation (Signed)
  Anesthesia Post-op Note  Patient: [name]  Procedure(s) Performed: Procedure(s): NASAL ENDOSCOPY WITH EPISTAXIS CONTROL (N/A)  Patient Location: PACU  Anesthesia Type:General  Level of Consciousness: awake and alert   Airway and Oxygen Therapy: Patient Spontanous Breathing and Patient connected to face mask oxygen  Post-op Pain: mild  Post-op Assessment: Post-op Vital signs reviewed, Patient's Cardiovascular Status Stable, Respiratory Function Stable, Patent Airway and No signs of Nausea or vomiting  Post-op Vital Signs: Reviewed and stable  Complications: No apparent anesthesia complications

## 2012-07-14 NOTE — ED Notes (Signed)
MD at bedside. Dr. Emeline Darling ENT. Consent Signed by Husband

## 2012-07-14 NOTE — Progress Notes (Signed)
I asked the MD if he wants the pt to receive her coumadin this evening, since she was admitted with recurrent epistaxis. MD notified me that this is correct, and she is to receive her coumadin tonight. Will continue to monitor for further bleeding after coumadin administration.

## 2012-07-14 NOTE — ED Notes (Signed)
Pt transported to OR with Tech and RN

## 2012-07-14 NOTE — ED Notes (Signed)
Pt kept stating that she wants to see MD-Dr Lazarus Salines, explained process of triage and ED; pt did not verbalize understanding, attempted to explain again but pt still unable to verbalize understanding

## 2012-07-14 NOTE — ED Notes (Addendum)
Verbal orders placed by [facility]. Pt refuses treatment until On call ENT MD notified. Consult order placed. Dr. Norlene Campbell by bedside

## 2012-07-14 NOTE — Anesthesia Procedure Notes (Signed)
Procedure Name: Intubation Date/Time: 07/14/2012 4:49 AM Performed by: Alanda Amass A Pre-anesthesia Checklist: Patient identified, Timeout performed, Emergency Drugs available, Suction available and Patient being monitored Patient Re-evaluated:Patient Re-evaluated prior to inductionOxygen Delivery Method: Circle system utilized Preoxygenation: Pre-oxygenation with 100% oxygen Intubation Type: IV induction, Rapid sequence and Cricoid Pressure applied Tube size: 7.5 mm Number of attempts: 3 Airway Equipment and Method: Stylet and Video-laryngoscopy Placement Confirmation: ETT inserted through vocal cords under direct vision,  breath sounds checked- equal and bilateral and positive ETCO2 Secured at: 21 cm Tube secured with: Tape Dental Injury: Teeth and Oropharynx as per pre-operative assessment  Difficulty Due To: Difficulty was unanticipated Comments: Dl x 2 with mac 3, unsuccessful.  Dl x1 with video glidescope (dr. Sampson Goon) with successul intubation

## 2012-07-14 NOTE — Progress Notes (Signed)
Patient's BP 74/40 HR 63,rechecked with manual BP 80/50.Call made to Dr. Glenford Peers message on answering machine to call back. 07:10-Received call from Dr. Rivka Spring made aware of pt's VS.MD to place orders.Will continue to monitor. [place], Charito Joselita,RN

## 2012-07-14 NOTE — H&P (Signed)
07/14/2012  [name]  PREOPERATIVE HISTORY AND PHYSICAL  CHIEF COMPLAINT: epistaxis  HISTORY: This is a 72 year old who presents with bilateral epistaxis.  She is a patient of Dr. Lazarus Salines and is on Coumadin. She now presents for bilateral nasal endoscopy and control of epistaxis.  Dr. Emeline Darling, [place] has discussed the risks (recurrent bleeding, risks of general anesthesia, etc.), benefits, and alternatives of this procedure. The patient understands the risks and would like to proceed with the procedure. The chances of success of the procedure are >50% and the patient understands this. I personally performed an examination of the patient within 24 hours of the procedure.  PAST MEDICAL HISTORY: Past Medical History  Diagnosis Date  . HTN (hypertension)   . Atrial fibrillation   . TIA (transient ischemic attack)   . Tuberculosis   . Aortic dilatation   . Pneumonia   . Coronary artery disease   . Epistaxis, recurrent     PAST SURGICAL HISTORY: Past Surgical History  Procedure Laterality Date  . Cesarean section    . 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   Current Outpatient Prescriptions on File Prior to Encounter  Medication Sig Dispense Refill  . amiodarone (PACERONE) 200 MG tablet Take 200 mg by mouth daily.       Marland Kitchen aspirin 81 MG tablet Take 81 mg by mouth daily.      . ergocalciferol (VITAMIN D2) 50000 UNITS capsule Take 50,000 Units by mouth once a week. Thursday      . ferrous sulfate 325 (65 FE) MG EC tablet Take 325 mg by mouth daily.      Marland Kitchen latanoprost (XALATAN) 0.005 % ophthalmic solution Place 1 drop into both eyes at bedtime.       . simethicone (MYLICON) 40 MG/0.6ML drops Take 0.6 mLs (40 mg total) by mouth 4 (four) times daily as  needed.  30 mL  0  . vitamin B-12 (CYANOCOBALAMIN) 50 MCG tablet Take 50 mcg by mouth daily.      Marland Kitchen warfarin (COUMADIN) 1 MG tablet Take 1 mg by mouth daily. Take 1 tablet on Sunday,Tuesday,Thursday & Saturday      . warfarin (COUMADIN) 2.5 MG tablet Take 2.5 mg by mouth daily. Take 1 tablet on Monday,Wednesday, & Friday        ALLERGIES: Allergies  Allergen Reactions  . Milk-Related Compounds   . Monosodium Glutamate     Headache, dizziness  . Penicillins     "simply doesn't work for me"      SOCIAL HISTORY: History   Social History  . Marital Status: Married    Spouse Name: N/A    Number of Children: 1  . Years of Education: N/A   Occupational History  . retired    Social History Main Topics  . Smoking status: Never Smoker   . Smokeless tobacco: Never Used  . Alcohol Use: No  . Drug Use: No  . Sexually Active: No   Other Topics Concern  . Not on file   Social History Narrative  . No narrative on file    FAMILY HISTORY: Family History  Problem Relation Age of Onset  . CVA Father 79    Died 11  . Colon cancer Neg Hx     REVIEW  OF SYSTEMS:  HEENT: epistaxis, otherwise negative x 10 systems   PHYSICAL EXAM:  GENERAL:  NAD VITAL SIGNS:   Filed Vitals:   07/14/12 0315  BP: 153/75  Pulse: 70  Resp:    SKIN:  Warm, dry HEENT:  Bilateral epistaxis NECK:  supple LYMPH:  No LAD LUNGS:  Grossly clear CARDIOVASCULAR:RRR   ABDOMEN:  soft MUSCULOSKELETAL: normal strength PSYCH:  Normal affect NEUROLOGIC: CN 2-12 intact and symmetric    ASSESSMENT AND PLAN: Plan to proceed with bilateral nasal endoscopy and control of hemorrhage/epistaxis. Patient understands the risks, benefits, and alternatives. Informed written consent on chart. 07/14/2012  3:58 AM [name]

## 2012-07-14 NOTE — Anesthesia Preprocedure Evaluation (Addendum)
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   Neuro/Psych TIA Neuromuscular disease negative psych ROS   GI/Hepatic negative GI ROS, Neg liver ROS,   Endo/Other  negative endocrine ROS  Renal/GU negative Renal ROS  negative genitourinary   Musculoskeletal   Abdominal   Peds  Hematology  (+) anemia ,   Anesthesia Other Findings   Reproductive/Obstetrics negative OB ROS                           Anesthesia Physical Anesthesia Plan  ASA: IV and emergent  Anesthesia Plan: General   Post-op Pain Management:    Induction: Intravenous, Rapid sequence and Cricoid pressure planned  Airway Management Planned: Oral ETT  Additional Equipment:   Intra-op Plan:   Post-operative Plan: Extubation in OR and Possible Post-op intubation/ventilation  Informed Consent: I have reviewed the patients History and Physical, chart, labs and discussed the procedure including the risks, benefits and alternatives for the proposed anesthesia with the patient or authorized representative who has indicated his/her understanding and acceptance.   Dental advisory given  Plan Discussed with: CRNA  Anesthesia Plan Comments:        Anesthesia Quick Evaluation

## 2012-07-14 NOTE — ED Notes (Signed)
Pt reports cauterization for nose bleed on feb 28th and then again on march 1st by Dr Lazarus Salines; pt reports nose bleed started at 1:50 am

## 2012-07-14 NOTE — ED Provider Notes (Signed)
History     CSN: [id number]  Arrival date & time 07/14/12  0240   First MD Initiated Contact with Patient 07/14/12 0309      Chief Complaint  Patient presents with  . Epistaxis    (Consider location/radiation/quality/duration/timing/severity/associated sxs/prior treatment) HPI 72 year old female presents to the emergency department with complaint of nose bleed.  Patient has history of recurrent nosebleeds, followed by Dr. Lazarus Salines.  She reports currentstarted at 150 this morning.  Patient has had multiple procedures, most recently on the first by Dr. Lazarus Salines.  She is currently having blood flowing from both naris and down the back of her throat.  Patient is refusing ED intervention at this time, reports she has had too many doctors fail in the past, and requests waiting to see a specialist. Past Medical History  Diagnosis Date  . HTN (hypertension)   . Atrial fibrillation   . TIA (transient ischemic attack)   . Tuberculosis   . Aortic dilatation   . Pneumonia   . Coronary artery disease   . Epistaxis, recurrent     Past Surgical History  Procedure Laterality Date  . Cesarean section    . 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 CVA Father 44    Died 51  . Colon cancer Neg Hx     History  Substance Use Topics  . Smoking status: Never Smoker   . Smokeless tobacco: Never Used  . Alcohol Use: No    OB History   Grav Para Term Preterm Abortions TAB SAB Ect Mult Living                  Review of Systems  Unable to perform ROS: Other   patient is unable to speak secondary to nosebleed, is communicating through writing  Allergies  Milk-related compounds; Monosodium glutamate; and Penicillins  Home Medications   Current Outpatient Rx  Name  Route  Sig  Dispense   Refill  . amiodarone (PACERONE) 200 MG tablet   Oral   Take 200 mg by mouth daily.          Marland Kitchen aspirin 81 MG tablet   Oral   Take 81 mg by mouth daily.         . ergocalciferol (VITAMIN D2) 50000 UNITS capsule   Oral   Take 50,000 Units by mouth once a week. Thursday         . ferrous sulfate 325 (65 FE) MG EC tablet   Oral   Take 325 mg by mouth daily.         Marland Kitchen latanoprost (XALATAN) 0.005 % ophthalmic solution   Both Eyes   Place 1 drop into both eyes at bedtime.          . simethicone (MYLICON) 40 MG/0.6ML drops   Oral   Take 0.6 mLs (40 mg total) by mouth 4 (four) times daily as needed.   30 mL   0   . vitamin B-12 (CYANOCOBALAMIN) 50 MCG tablet   Oral   Take 50 mcg by mouth daily.         Marland Kitchen warfarin (COUMADIN) 1 MG tablet   Oral   Take 1 mg by mouth daily. Take 1 tablet on Sunday,Tuesday,Thursday & Saturday         .  warfarin (COUMADIN) 2.5 MG tablet   Oral   Take 2.5 mg by mouth daily. Take 1 tablet on Monday,Wednesday, & Friday           BP 153/75  Pulse 70  Resp 20  SpO2 98%  Physical Exam  Nursing note and vitals reviewed. Constitutional: She appears distressed.  HENT:  Head: Normocephalic and atraumatic.  Patient with active bleeding from both naris, right greater than left.  She is also spitting up blood as well  Neck: Normal range of motion. Neck supple. No JVD present. No tracheal deviation present. No thyromegaly present.  Cardiovascular: Normal rate, regular rhythm, normal heart sounds and intact distal pulses.  Exam reveals no gallop and no friction rub.   No murmur heard. Pulmonary/Chest: Effort normal and breath sounds normal. No stridor. No respiratory distress. She has no wheezes. She has no rales. She exhibits no tenderness.  Abdominal: Soft. Bowel sounds are normal. She exhibits no distension and no mass. There is no tenderness. There is no rebound and no guarding.  Lymphadenopathy:    She has no cervical adenopathy.   Skin: Skin is warm and dry. No rash noted. No erythema. No pallor.    ED Course  Procedures (including critical care time)  Labs Reviewed  CBC WITH DIFFERENTIAL  PROTIME-INR   No results found.   1. Epistaxis, recurrent       MDM  72 year old female with epistaxis.  Unable to assess source of bleeding, secondary to patient refusal.  She is not wishing to be seen by me or have any interventional treatment done.  Discuss case with Dr. Emeline Darling, on-call for Dr. Lazarus Salines.  He will take the patient to the operating room for epistaxis control        Olivia Mackie, MD 07/14/12 [phone]

## 2012-07-14 NOTE — Progress Notes (Signed)
07/14/12 Received patient from PACU,pt awake,alert and oriented.Bilateral nasal packing in place,no active bleeding noted.Yankauer suction by bedside. Chandi Nicklin Joselita,RN

## 2012-07-14 NOTE — Progress Notes (Signed)
Utilization review completed.  

## 2012-07-14 NOTE — ED Notes (Signed)
Pt came in distraught and upset. Pt demanding a MD. Pt states there are is only one MD to treat her nose bleed. RN and techs were able to calm patient down to put on a gown. Pt states she is on a blood thinner and bleeding has been going on since 1:50. Pt states she was recently seen for a nose bleed at the beginning of the month. Pt stable, alert and oriented. No signs of distress noted. Pt bleeding is not controlled. Pt bleeding from mouth and both nostrils. IV started. MD notified

## 2012-07-14 NOTE — Progress Notes (Addendum)
Pt expressed to me that she is not happy with Dr. Emeline Darling, and would prefer for Dr. Lazarus Salines to see her, since she has seen him for years. I told the pt I am not sure if we can make that happen, but I would be happy to call Dr. Raye Sorrow office. I called and left a message for Dr. Raye Sorrow nurse.   Dr. Raye Sorrow nurse returned phone call and notified me that Dr. Lazarus Salines is out of the office, and will not be available. Pt notified of this.

## 2012-07-14 NOTE — Progress Notes (Signed)
Subjective: POD#0 from bilateral nasal endoscopy with cauterization for severe acute epistaxis  Objective: Vital signs in last 24 hours: Temp:  [97.4 F (36.3 C)-98.6 F (37 C)] 98.4 F (36.9 C) (03/12 0906) Pulse Rate:  [60-70] 62 (03/12 0906) Resp:  [16-20] 18 (03/12 0906) BP: (75-179)/(40-89) 117/54 mmHg (03/12 0906) SpO2:  [96 %-100 %] 100 % (03/12 0906) Weight:  [63.5 kg (139 lb 15.9 oz)] 63.5 kg (139 lb 15.9 oz) (03/12 0641)  Nasal cavity hemostatic with bilateral meroceles in place in nasal cavity secure to patient's face. Awake, alert, no acute distress, CN 2-12 intact and symmetric. EOMI, PERRLA.  @LABLAST2 (wbc:2,hgb:2,hct:2,plt:2) No results found for this basename: NA, K, CL, CO2, GLUCOSE, BUN, CREATININE, CALCIUM,  in the last 72 hours  Medications:  Scheduled Meds: . amiodarone  200 mg Oral Daily  . aspirin  81 mg Oral Daily  . clindamycin (CLEOCIN) IV  600 mg Intravenous Q12H  . ferrous sulfate  325 mg Oral Daily  . HYDROmorphone      . latanoprost  1 drop Both Eyes QHS  . oxymetazoline  1 spray Each Nare Once  . oxymetazoline  2 spray Each Nare TID  . vitamin B-12  50 mcg Oral Daily  . [START ON 07/15/2012] Vitamin D (Ergocalciferol)  50,000 Units Oral Weekly  . [START ON 07/15/2012] warfarin  1 mg Oral Q T,Th,S,Su-1800  . warfarin  2.5 mg Oral Q M,W,F-1800  . Warfarin - Physician Dosing Inpatient   Does not apply q1800   Continuous Infusions: . dextrose 5 % and 0.9% NaCl 75 mL/hr at 07/14/12 0937   PRN Meds:.acetaminophen, hydrALAZINE, HYDROcodone-acetaminophen, morphine injection, ondansetron (ZOFRAN) IV, ondansetron, phenol, simethicone  Assessment/Plan: POD#0 from endoscopic sphenopalatine artery cauterization and R>L nasal cautery for acute severe epistaxis. Patient is not happy with her care from me per RN and would like to see Dr. Lazarus Salines back so I talked to Dr. Raye Sorrow nurse and she can see Dr. Lazarus Salines back in the office in about 7 days to have her  packs removed. Patient does not feel she can go home at this time because she does not have any help at home so will discharge home when patient feels she is able. I put her scripts for clindamycin, hydrocodone, and Zofran on her chart. If she has further bleeding next step would be consult to interventional radiology for endovascular internal maxillary artery/facial artery embolization.   LOS: 0 days   Melvenia Beam 07/14/2012, 2:27 PM

## 2012-07-14 NOTE — ED Notes (Signed)
MD at bedside. (Dr. Otter) 

## 2012-07-15 ENCOUNTER — Encounter (HOSPITAL_COMMUNITY): Payer: Self-pay

## 2012-07-15 LAB — BASIC METABOLIC PANEL
Calcium: 8.1 mg/dL — ABNORMAL LOW (ref 8.4–10.5)
GFR calc Af Amer: >90 mL/min (ref >90)
GFR calc non Af Amer: 85 mL/min — ABNORMAL LOW (ref >90)
Glucose, Bld: 110 mg/dL — ABNORMAL HIGH (ref 70–99)
Potassium: 3.4 mEq/L — ABNORMAL LOW (ref 3.5–5.1)
Sodium: 138 mEq/L (ref 135–145)

## 2012-07-15 LAB — PROTIME-INR
INR: 2.72 — ABNORMAL HIGH (ref 0.00–1.49)
Prothrombin Time: 27.5 seconds — ABNORMAL HIGH (ref 11.6–15.2)

## 2012-07-15 LAB — CBC
Hemoglobin: 7.3 g/dL — ABNORMAL LOW (ref 12.0–15.0)
MCH: 28.1 pg (ref 26.0–34.0)
MCHC: 32.6 g/dL (ref 30.0–36.0)
Platelets: 187 10*3/uL (ref 150–400)

## 2012-07-15 MED ORDER — METRONIDAZOLE 500 MG PO TABS
500.0000 mg | ORAL_TABLET | Freq: Three times a day (TID) | ORAL | Status: DC
  Administered 2012-07-15 – 2012-07-16 (×4): 500 mg via ORAL
  Filled 2012-07-15 (×6): qty 1

## 2012-07-15 MED ORDER — POTASSIUM CHLORIDE 20 MEQ/15ML (10%) PO LIQD
40.0000 meq | Freq: Once | ORAL | Status: AC
  Administered 2012-07-15: 40 meq via ORAL
  Filled 2012-07-15: qty 30

## 2012-07-15 MED ORDER — DIPHENHYDRAMINE HCL 12.5 MG/5ML PO ELIX
12.5000 mg | ORAL_SOLUTION | Freq: Once | ORAL | Status: AC
  Administered 2012-07-15: 12.5 mg via ORAL
  Filled 2012-07-15: qty 5

## 2012-07-15 MED ORDER — CALCIUM CARBONATE ANTACID 500 MG PO CHEW
2.0000 | CHEWABLE_TABLET | Freq: Once | ORAL | Status: AC
  Administered 2012-07-15: 400 mg via ORAL
  Filled 2012-07-15: qty 2

## 2012-07-15 NOTE — Evaluation (Signed)
Physical Therapy Evaluation Patient Details Name: [name] MRN: [id number] DOB: 11-22-1940 Today's Date: 07/15/2012 Time: 4098-1191 PT Time Calculation (min): 32 min  PT Assessment / Plan / Recommendation Clinical Impression  72 yo female with history of afib, open heart surgery and recent nosebleeds is admitted for endoscopic cauterization.  She had loss of blood with hemoglobin currently at 7.3.  Pt did well with moiblity and gait without device today, but she states she will not have any assist at home and feels too weak to d/c today.  Anticipate she will continue to feel stronger as she recovers.  Do not feel she needs follow up PT or DME at discharge.  Will keep on acute caseload and encourage pt to walk with nursing ad lib while in hospital Suggested pt may need to hire personal care attendant for short term if she does not improve and no family or friends are available    PT Assessment  Patient needs continued PT services    Follow Up Recommendations  No PT follow up    Does the patient have the potential to tolerate intense rehabilitation      Barriers to Discharge Decreased caregiver support      Equipment Recommendations  None recommended by PT    Recommendations for Other Services     Frequency Min 3X/week    Precautions / Restrictions Precautions Precautions: Fall Precaution Comments: pt reports she is "still a little bit shaky" after OR yesterday Restrictions Weight Bearing Restrictions: No   Pertinent Vitals/Pain Pt with packing in nose, and has minor discomfort from that      Mobility  Bed Mobility Bed Mobility: Supine to Sit;Sit to Supine Supine to Sit: 7: Independent Sit to Supine: 7: Independent Transfers Transfers: Sit to Stand;Stand to Sit Sit to Stand: 7: Independent Stand to Sit: 7: Independent Details for Transfer Assistance: able to repeat sit to stand 5 times without difficulty Ambulation/Gait Ambulation/Gait Assistance: 5:  Supervision Ambulation Distance (Feet): 200 Feet Assistive device: None Gait Pattern: Step-through pattern Gait velocity: WFL General Gait Details: pt keeps hands behind her back for stability. She has occasional episodes of unsteadiness while walking., but did not lose balance enough to fall Stairs: No Wheelchair Mobility Wheelchair Mobility: No    Exercises     PT Diagnosis: Generalized weakness  PT Problem List: Decreased strength;Decreased activity tolerance PT Treatment Interventions: Gait training;Stair training   PT Goals Acute Rehab PT Goals PT Goal Formulation: With patient Time For Goal Achievement: 07/22/12 Potential to Achieve Goals: Good Pt will Ambulate: >150 feet;Independently PT Goal: Ambulate - Progress: Goal set today Pt will Go Up / Down Stairs: 6-9 stairs;with modified independence;with rail(s) PT Goal: Up/Down Stairs - Progress: Goal set today  Visit Information  Last PT Received On: 07/15/12    Subjective Data  Subjective: "I think I need one more day" Patient Stated Goal: to go home tomorrow   Prior Functioning  Home Living Lives With: Spouse (spouse works long days --pt is alone) Available Help at Discharge: Other (Comment) (pt has no assist during the day.  Husband not able to help) Type of Home: House Home Access: Stairs to enter Home Layout: Two level Additional Comments: pt talkative about everything she has been through. She had HHPT after open heart surgery Prior Function Level of Independence: Independent Able to Take Stairs?: Yes Communication Communication: No difficulties    Cognition  Cognition Overall Cognitive Status: Appears within functional limits for tasks assessed/performed Arousal/Alertness: Awake/alert Orientation Level: Appears  intact for tasks assessed Behavior During Session: [facility] for tasks performed    Extremity/Trunk Assessment Right Lower Extremity Assessment RLE ROM/Strength/Tone: [facility] for tasks assessed Left  Lower Extremity Assessment LLE ROM/Strength/Tone: [facility] for tasks assessed Trunk Assessment Trunk Assessment: Other exceptions Trunk Exceptions: pt with generalized muscle atrophy.  She states she has lost alot of muscle mass since developing afib   Balance Balance Balance Assessed: Yes Static Sitting Balance Static Sitting - Balance Support: No upper extremity supported Static Sitting - Level of Assistance: 7: Independent Static Standing Balance Static Standing - Balance Support: No upper extremity supported Static Standing - Level of Assistance: 7: Independent Standardized Balance Assessment Standardized Balance Assessment: Berg Balance Test Berg Balance Test Sit to Stand: Able to stand without using hands and stabilize independently Standing Unsupported: Able to stand safely 2 minutes Sitting with Back Unsupported but Feet Supported on Floor or Stool: Able to sit safely and securely 2 minutes Stand to Sit: Sits safely with minimal use of hands Transfers: Able to transfer safely, minor use of hands Standing Unsupported with Eyes Closed: Able to stand 10 seconds safely Standing Ubsupported with Feet Together: Able to place feet together independently and stand for 1 minute with supervision From Standing, Reach Forward with Outstretched Arm: Can reach confidently >25 cm (10") From Standing Position, Pick up Object from Floor: Able to pick up shoe safely and easily From Standing Position, Turn to Look Behind Over each Shoulder: Looks behind from both sides and weight shifts well Turn 360 Degrees: Able to turn 360 degrees safely in 4 seconds or less Standing Unsupported, Alternately Place Feet on Step/Stool: Able to stand independently and safely and complete 8 steps in 20 seconds Standing Unsupported, One Foot in Front: Able to plae foot ahead of the other independently and hold 30 seconds Standing on One Leg: Able to lift leg independently and hold 5-10 seconds Total Score: 53  End of  Session PT - End of Session Activity Tolerance: Patient tolerated treatment well Patient left: in bed;with bed alarm set Nurse Communication: Mobility status  GP    Bayard Hugger. Manson Passey, [place] [phone] 07/15/2012, 11:39 AM

## 2012-07-15 NOTE — Progress Notes (Signed)
Brief Nutrition Note:  Pt answered "unsure" to "Have you recently lost weight without trying?" generating a MST (Malnutrition Screening Tool) score of 2.   Weight hx:  Wt Readings from Last 5 Encounters:  07/15/12 138 lb 11.2 oz (62.914 kg)  07/15/12 138 lb 11.2 oz (62.914 kg)  06/22/12 136 lb 6 oz (61.859 kg)  05/27/12 138 lb 12.8 oz (62.959 kg)  05/23/12 137 lb 12.8 oz (62.506 kg)   Body mass index is 24.18 kg/(m^2). WNL  Current diet is full liquids.   Chart reviewed, no nutrition interventions warranted at this time, please consult as needed.   Clarene Duke RD, LDN Pager [phone] After Hours pager [phone]

## 2012-07-15 NOTE — Progress Notes (Addendum)
Repeat PT/INR is 27/2.75, supratherapeutic, will hold Coumadin for now given life-threatening severe epistaxis and check daily PT/INR, and try to consult Hospitalists for help with aspirin/coumadin management. Repeat H/H today has dropped to 7.3/22.4 and patient is symptomatic with fatigue so will transfuse 2 units PRBCs. hospitalists did not return consult page.

## 2012-07-15 NOTE — Progress Notes (Signed)
Subjective: POD#1 from nasal endoscopy with bilateral control of epistaxis and cautery with bilateral merocele packing. No active epistaxis, patient states she feels too weak to go home today, patient also wants a cardiology evaluation "just to check her heart", vital signs have been stable throughout her course other than brief post-op mild hypotension that resolved with 1L NS bolus.  Objective: Vital signs in last 24 hours: Temp:  [97.9 F (36.6 C)-98.4 F (36.9 C)] 98.4 F (36.9 C) (03/13 0549) Pulse Rate:  [62-73] 65 (03/13 0549) Resp:  [17-18] 17 (03/13 0549) BP: (117-135)/(54-78) 130/65 mmHg (03/13 0549) SpO2:  [95 %-100 %] 99 % (03/13 0549) Weight:  [62.914 kg (138 lb 11.2 oz)] 62.914 kg (138 lb 11.2 oz) (03/13 0549)  No active epistaxis, awake, alert, packing in place and secure bilaterally  @LABLAST2 (wbc:2,hgb:2,hct:2,plt:2) No results found for this basename: NA, K, CL, CO2, GLUCOSE, BUN, CREATININE, CALCIUM,  in the last 72 hours  Medications:  Scheduled Meds: . amiodarone  200 mg Oral Daily  . antiseptic oral rinse  15 mL Mouth Rinse q12n4p  . aspirin  81 mg Oral Daily  . chlorhexidine  15 mL Mouth Rinse BID  . clindamycin (CLEOCIN) IV  600 mg Intravenous Q12H  . ferrous sulfate  325 mg Oral Daily  . latanoprost  1 drop Both Eyes QHS  . oxymetazoline  1 spray Each Nare Once  . oxymetazoline  2 spray Each Nare TID  . vitamin B-12  50 mcg Oral Daily  . Vitamin D (Ergocalciferol)  50,000 Units Oral Weekly  . warfarin  1 mg Oral Q T,Th,S,Su-1800  . warfarin  2.5 mg Oral Q M,W,F-1800  . Warfarin - Physician Dosing Inpatient   Does not apply q1800   Continuous Infusions: . dextrose 5 % and 0.9% NaCl 75 mL/hr at 07/14/12 0937   PRN Meds:.acetaminophen, hydrALAZINE, HYDROcodone-acetaminophen, morphine injection, ondansetron (ZOFRAN) IV, ondansetron, phenol, simethicone  Assessment/Plan: POD#1 from nasal endoscopy and cautery with packing for severe acute epistaxis. As  patient is not having chest pain or vital sign abnormalities, she can follow up with her cardiologist/internist outpatient for cardiovascular evaluation. Is stable to go home from ENT standpoint as her bleeding has been controlled and her packing will stay in for 1 week and she can see Dr. Lazarus Salines as an outpatient to have her packs removed in the office. She insists that she is too weak to go home so I will check labs to see if she needs a transfusion (H/H 3/12 was 9.5/27) and will consult PT/OT to see if she needs to go to a SNF as she states that she does not have adequate support at home although was apparently living at home with her husband prior to her admission for this episode of epistaxis.   LOS: 1 day   Melvenia Beam 07/15/2012, 8:48 AM

## 2012-07-16 LAB — CBC
HCT: 28.4 % — ABNORMAL LOW (ref 36.0–46.0)
MCHC: 33.5 g/dL (ref 30.0–36.0)
Platelets: 170 10*3/uL (ref 150–400)
RDW: 18.7 % — ABNORMAL HIGH (ref 11.5–15.5)
WBC: 4 10*3/uL (ref 4.0–10.5)

## 2012-07-16 LAB — POCT I-STAT 4, (NA,K, GLUC, HGB,HCT)
Glucose, Bld: 104 mg/dL — ABNORMAL HIGH (ref 70–99)
Potassium: 3.2 mEq/L — ABNORMAL LOW (ref 3.5–5.1)
Sodium: 143 mEq/L (ref 135–145)

## 2012-07-16 LAB — PROTIME-INR
INR: 2.51 — ABNORMAL HIGH (ref 0.00–1.49)
Prothrombin Time: 25.9 seconds — ABNORMAL HIGH (ref 11.6–15.2)

## 2012-07-16 LAB — TYPE AND SCREEN
ABO/RH(D): AB POS
Unit division: 0

## 2012-07-16 MED ORDER — POLYETHYLENE GLYCOL 3350 17 G PO PACK
17.0000 g | PACK | Freq: Every day | ORAL | Status: DC | PRN
  Filled 2012-07-16: qty 1

## 2012-07-16 MED ORDER — ASPIRIN 81 MG PO CHEW: 81.0000 mg | CHEWABLE_TABLET | Freq: Every day | ORAL | Status: DC

## 2012-07-16 MED ORDER — DOCUSATE SODIUM 100 MG PO CAPS: 100.0000 mg | ORAL_CAPSULE | Freq: Two times a day (BID) | ORAL | Status: DC | PRN

## 2012-07-16 NOTE — Progress Notes (Signed)
Subjective: POD#2 from bilateral nasal endoscopy with cautery/SPA cauterization. Patient received 2 U PRBCs yesterday and H/H increased appropriately to 9.5/25. Patient feels better after blood but still wants to wait until Saturday to go home.  Objective: Vital signs in last 24 hours: Temp:  [97.2 F (36.2 C)-98.5 F (36.9 C)] 98.4 F (36.9 C) (03/14 0424) Pulse Rate:  [59-71] 62 (03/14 0424) Resp:  [16-18] 18 (03/14 0424) BP: (123-177)/(66-87) 136/70 mmHg (03/14 0424) SpO2:  [93 %-98 %] 94 % (03/14 0424)  CN 2-12 intact and symmetric, nasal packs in place bilaterally with no epistaxis. EOMI, PERRLA  @LABLAST2 (wbc:2,hgb:2,hct:2,plt:2)  Recent Labs  07/15/12 0930  NA 138  K 3.4*  CL 104  CO2 28  GLUCOSE 110*  BUN 7  CREATININE 0.70  CALCIUM 8.1*    Medications:  Scheduled Meds: . amiodarone  200 mg Oral Daily  . antiseptic oral rinse  15 mL Mouth Rinse q12n4p  . aspirin  81 mg Oral Daily  . chlorhexidine  15 mL Mouth Rinse BID  . clindamycin (CLEOCIN) IV  600 mg Intravenous Q12H  . ferrous sulfate  325 mg Oral Daily  . latanoprost  1 drop Both Eyes QHS  . metroNIDAZOLE  500 mg Oral Q8H  . oxymetazoline  1 spray Each Nare Once  . oxymetazoline  2 spray Each Nare TID  . vitamin B-12  50 mcg Oral Daily  . Vitamin D (Ergocalciferol)  50,000 Units Oral Weekly   Continuous Infusions: . dextrose 5 % and 0.9% NaCl 75 mL/hr at 07/16/12 0747   PRN Meds:.acetaminophen, hydrALAZINE, HYDROcodone-acetaminophen, morphine injection, ondansetron (ZOFRAN) IV, ondansetron, phenol, simethicone  Assessment/Plan: POD#2 from bilateral nasal endoscopy and cautery for severe acute epistaxis. Patient still feels she needs another day in the hospital. Patient wants to see Dr. Lazarus Salines back next week for packing removal so can see Dr. Lazarus Salines in the office next Wednesday. Rx for clindamycin, hydrocodone, and ZOfran on chart. No heavy lifting >25lbs until follow up, INR supratherapeutic so hold  Coumadin until patient sees Dr. Lazarus Salines back next week. Can continue baby aspirin given artificial valves, CAD, Afib. Anticipate D/C tomorrow.   LOS: 2 days   Melvenia Beam 07/16/2012, 8:45 AM

## 2012-07-16 NOTE — Discharge Summary (Signed)
07/16/2012  1:53 PM  Date of Admission:07/14/2012 Date of Discharge:07/16/2012  Discharge ZO:XWRU, Clovis Riley  Admitting EA:VWUJ, Clovis Riley  Reason for admission/final discharge diagnosis: severe epistaxis  Labs:reviewed in EPIC  Procedure(s) performed:07/14/12 [id number] bilateral nasal endoscopy with control of epistaxis  Discharge Condition:improved  Discharge Exam:CN 2-12 intact, EOMI, PERLLA, nasal packing in place and nose hemostatic  Discharge Instructions: No heavy lifting x 2 weeks, hold coumadin until you see Dr. Lazarus Salines back, otherwise restart regular diet and home meds including baby aspirin, Rx for clindamycin, zofran, and hydrocodone are on your chart, follow up with  Dr. Lazarus Salines At [facility] one week after surgery (next Wednesday).  Hospital Course: admitted for severe epistaxis related to coumadin/aspirin on 07/14/12. Nose packed, no epistaxis post-op. Transfused 2 UNits PRBCs on 07/15/12 for hemoglobin 7.3 which increased to 9.5 after blood transfusion. Patient felt better by POD#2 and discharged home in improved condition on 07/16/12.  Melvenia Beam 1:53 PM @T @

## 2012-07-16 NOTE — Progress Notes (Signed)
Received order to discharge patient.  This RN concerned with patient's pattern of hemoglobin.  07/14/12, Hemoglobin was 10.2.  07/15/12 Hemoglobin resulted as 7.3.  Patient complaining of drainage from the nares consistently.  MD made aware of this.  This morning, patient's hemoglobin resulted as 9.5.  Patient did receive two units of PRBC's yesterday.  Patient felt better this morning and was ambulating about the room.  However, patient again complains of weakness.  This RN asked MD if a repeat CBC would be appropriate.  MD advises that this morning's CBC was the repeat CBC.  MD advises we would not be giving this patient more blood because blood is for patients with an active bleed which she does not have.  MD advised this RN to discharge patient home. Patient's vital signs are stable at this time.  After speaking to the patient, she states she is willing to do whatever the doctor advises.

## 2012-07-16 NOTE — Consult Note (Addendum)
[facility] Medical Consultation  [name] ZOX:[id number] DOB: 1940/08/12 DOA: 07/14/2012 PCP: Tonye Pearson, MD   Requesting physician: Dr Melvenia Beam.  Date of consultation: 07/16/12.  Reason for consultation: Management of medical issues, with a view to possibly taking over as primary service.   Impression/Recommendations Active Problems:   * No active hospital problems. *    1. Severe recurrent bilateral epistaxis:  This was the presenting problem and was addressed with bilateral nasal endoscopy with control of epistaxis and endoscopic ligation/cauterization of bilateral sphenopalatine artery on 07/14/12. Today is post-procedure day# 1, and there has been no recurrence. Patient will follow up with Dr Flo Shanks on 07/19/12.  2. Anemia: This is due to acute blood loss, from #1. Patient presented with HB of 9.5-10.2, but HB dropped to 7.3 peri-procedurally. Appropriately transfused with 2 units PRBC, resulting in satisfactory bump in HB to 9.5. Patient is hemodynamically stable at this time, and no recurrence of epistaxis is evident. 3. HTN: Controlled at this time. 4. PAF: Currently in SR. Patient is on chronic Coumadin anticoagulation, which no doubt exacerbated her epistaxis. If these episodes continue, the question of continued anticoagulation has to be reviewed by PMD and cardiologist.  5. History of valvular heart disease: Patient is s/p aortic valve replacement with porcine valve/mitral valve repair maze procedure and left atrial appendage ligation 12/2011. Clinically, she exhibits no features of CHF.   Comment: Patient is stable from medical and ENT viewpoints, for discharge, and after detailed discussion with me, during which I answered all questions to her satisfaction, she has agreed to go home today as originally planned by primary service. There is therefore, no indication at this time, for medical service to take over care. Thank you for this consultation.  Feel free to call with questions.   Chief Complaint: Epistaxis.   HPI:  72 year old female, with known history of HTN, atrial fibrillation, TIA, CAD, valvular heart disease s/p aortic valve replacement with porcine valve/mitral valve repair maze procedure and left atrial appendage ligation 12/2011, recurrent epistaxis, under care of Dr Flo Shanks, admitted on 07/14/12, with bilateral epistaxis. She was taken to OR and underwent bilateral nasal endoscopy with control of epistaxis and endoscopic ligation/cauterization of bilateral sphenopalatine artery on the same day. Peri-procedurally, she had ABLA with HB 7.3 and transfused 2 units PRBC, with bump in HB to 9.5   Review of Systems:  As per HPI and chief complaint. Patent denies fatigue, diminished appetite, weight loss, fever, chills, headache, blurred vision, difficulty in speaking, dysphagia, chest pain, cough, shortness of breath, orthopnea, paroxysmal nocturnal dyspnea, nausea, diaphoresis, abdominal pain, vomiting, diarrhea, belching, heartburn, hematemesis, melena, dysuria, nocturia, urinary frequency, hematochezia, lower extremity swelling, pain, or redness. She has been constipated for a few days. The rest of the systems review is negative.   Past Medical History  Diagnosis Date  . HTN (hypertension)   . Atrial fibrillation   . TIA (transient ischemic attack)   . Tuberculosis   . Aortic dilatation   . Pneumonia   . Coronary artery disease   . Epistaxis, recurrent    Past Surgical History  Procedure Laterality Date  . Cesarean section    . Cardiac surgery       Bentalll 25  mm Freestyle porcine aortic valve, mitral repair 26 mm reein, tricuspid repair 25 mm rein, biatrial maze, ligation of left atrial appendage 12/26/2011   Social History:  reports that she has never smoked. She has never used smokeless tobacco.  She reports that she does not drink alcohol or use illicit drugs.  Allergies  Allergen Reactions  . Citrus Nausea  And Vomiting  . Milk-Related Compounds   . Monosodium Glutamate     Headache, dizziness  . Penicillins     "simply doesn't work for me"    Family History  Problem Relation Age of Onset  . CVA Father 39    Died 44  . Colon cancer Neg Hx     Prior to Admission medications   Medication Sig Start Date End Date Taking? Authorizing Provider  amiodarone (PACERONE) 200 MG tablet Take 200 mg by mouth daily.    Yes Historical Provider, MD  aspirin 81 MG tablet Take 81 mg by mouth daily.   Yes Historical Provider, MD  ergocalciferol (VITAMIN D2) 50000 UNITS capsule Take 50,000 Units by mouth once a week. Thursday   Yes Historical Provider, MD  ferrous sulfate 325 (65 FE) MG EC tablet Take 325 mg by mouth daily.   Yes Historical Provider, MD  latanoprost (XALATAN) 0.005 % ophthalmic solution Place 1 drop into both eyes at bedtime.    Yes Historical Provider, MD  ramipril (ALTACE) 5 MG capsule Take 5 mg by mouth daily.   Yes Historical Provider, MD  vitamin B-12 (CYANOCOBALAMIN) 50 MCG tablet Take 50 mcg by mouth daily.   Yes Historical Provider, MD   Physical Exam: Blood pressure 136/70, pulse 62, temperature 98.4 F (36.9 C), temperature source Oral, resp. rate 18, height 5' 3.5" (1.613 m), weight 62.914 kg (138 lb 11.2 oz), SpO2 94.00%. Filed Vitals:   07/15/12 1818 07/15/12 1900 07/15/12 2133 07/16/12 0424  BP: 137/68 132/74 157/83 136/70  Pulse: 63 59 67 62  Temp: 98.3 F (36.8 C) 97.9 F (36.6 C) 98.3 F (36.8 C) 98.4 F (36.9 C)  TempSrc: Oral Oral Oral Oral  Resp: 18 16 18 18   Height:      Weight:      SpO2:  98% 95% 94%    General:  Patient does not appear to be in obvious acute distress. Alert, communicative, fully oriented, talking in complete sentences, not short of breath at rest, quite garrulous.  HEENT:  Mild clinical pallor, no jaundice, no conjunctival injection or discharge. Hydration is satisfactory. Has bilateral nasal packs, which appear dry.  NECK:  Supple, JVP  not seen, no carotid bruits, no palpable lymphadenopathy, no palpable goiter. CHEST:  Clinically clear to auscultation, no wheezes, no crackles. HEART:  Sounds 1 and 2 heard, normal, regular, no murmurs. ABDOMEN:  Full, soft, non-tender, no palpable organomegaly, no palpable masses, normal bowel sounds. GENITALIA:  Not examined. LOWER EXTREMITIES:  No pitting edema, palpable peripheral pulses. MUSCULOSKELETAL SYSTEM:  Generalized osteoarthritic changes, otherwise, normal. CENTRAL NERVOUS SYSTEM:  No focal neurologic deficit on gross examination.  Labs on Admission:  Basic Metabolic Panel:  Recent Labs Lab 07/14/12 0440 07/15/12 0930  NA 143 138  K 3.2* 3.4*  CL  --  104  CO2  --  28  GLUCOSE 104* 110*  BUN  --  7  CREATININE  --  0.70  CALCIUM  --  8.1*   Liver Function Tests: No results found for this basename: AST, ALT, ALKPHOS, BILITOT, PROT, ALBUMIN,  in the last 168 hours No results found for this basename: LIPASE, AMYLASE,  in the last 168 hours No results found for this basename: AMMONIA,  in the last 168 hours CBC:  Recent Labs Lab 07/14/12 0322 07/14/12 0440 07/15/12 0930 07/16/12 0535  WBC 3.5*  --  4.1 4.0  NEUTROABS 2.1  --   --   --   HGB 9.5* 10.2* 7.3* 9.5*  HCT 29.1* 30.0* 22.4* 28.4*  MCV 87.1  --  86.2 85.8  PLT 237  --  187 170   Cardiac Enzymes: No results found for this basename: CKTOTAL, CKMB, CKMBINDEX, TROPONINI,  in the last 168 hours BNP: No components found with this basename: POCBNP,  CBG: No results found for this basename: GLUCAP,  in the last 168 hours  Radiological Exams on Admission: No results found.  EKG: N/A.   Time spent: 40 mins.   OTI,CHRISTOPHER [facility] Pager [phone].  If 7PM-7AM, please contact night-coverage [email or web address] Password [facility] 07/16/2012, 3:33 PM

## 2012-07-17 ENCOUNTER — Encounter (HOSPITAL_COMMUNITY): Payer: Self-pay | Admitting: Otolaryngology

## 2012-07-21 ENCOUNTER — Telehealth: Payer: Self-pay | Admitting: *Deleted

## 2012-07-21 NOTE — Telephone Encounter (Signed)
Patient came in for breath test. She reports she had nasal surgery last week and received antibiotics. She stopped antibiotics on Friday. Rescheduled the breath test in 4 weeks per guidelines to 08/17/12 at 8:30 AM. Patient given instructions verbally and written. All questions answered.

## 2012-07-25 ENCOUNTER — Ambulatory Visit (INDEPENDENT_AMBULATORY_CARE_PROVIDER_SITE_OTHER): Payer: BC Managed Care – PPO | Admitting: Family Medicine

## 2012-07-25 VITALS — BP 151/72 | HR 60 | Temp 98.0°F | Resp 18 | Ht 63.25 in | Wt 132.0 lb

## 2012-07-25 DIAGNOSIS — D649 Anemia, unspecified: Secondary | ICD-10-CM

## 2012-07-25 DIAGNOSIS — I10 Essential (primary) hypertension: Secondary | ICD-10-CM

## 2012-07-25 DIAGNOSIS — R040 Epistaxis: Secondary | ICD-10-CM

## 2012-07-25 DIAGNOSIS — Z7901 Long term (current) use of anticoagulants: Secondary | ICD-10-CM

## 2012-07-25 LAB — POCT CBC
HCT, POC: 33.5 % — AB (ref 37.7–47.9)
Hemoglobin: 9.9 g/dL — AB (ref 12.2–16.2)
Lymph, poc: 1.1 (ref 0.6–3.4)
MCH, POC: 27.3 pg (ref 27–31.2)
MCHC: 29.6 g/dL — AB (ref 31.8–35.4)
MCV: 92.6 fL (ref 80–97)
POC LYMPH PERCENT: 30.9 %L (ref 10–50)
WBC: 3.4 10*3/uL — AB (ref 4.6–10.2)

## 2012-07-25 LAB — PROTIME-INR
INR: 1.49 (ref <1.50)
Prothrombin Time: 17.7 seconds — ABNORMAL HIGH (ref 11.6–15.2)

## 2012-07-25 NOTE — Patient Instructions (Signed)
Recheck with Dr. Merla Riches in 2 days to discuss your blood pressure, and plan for following the coumadin. Keep a record of your blood pressures outside of the office and bring them and your machine to the next office visit. Continue ramipril 5mg  - once per day. Return to the clinic or go to the nearest emergency room if any of your symptoms worsen or new symptoms occur.

## 2012-07-25 NOTE — Progress Notes (Signed)
Subjective:    Patient ID: [name], female    DOB: 1941/04/09, 72 y.o.   MRN: [id number]  HPI [name] is a 72 y.o. female  Primary provider - [name]. Followed at [facility] for heart issues.   S/p recent hospitalization for severe bilaterral epistaxis, s/p nasal endoscopy and endoscopic ligation/cauterization of sphenopalatine arteries 07/14/12.  ENT- [name] in hospital, followed up by [name] as outpatient.  Anemia in hospital with HGB 9.5 to 7.3. Transfused with 2 units PRBC, with elevation to HGB 9.5 on 07/16/12 post transfusion. No nosebleeds since hospitalizations.  Plan on 6 week follow up.   Hx of PAF, and s/p AVR with porcine valve and mitral valve repair.  On Coumadin, with last INR 2.51 on 07/16/12. Followed by [name] coumadin clinic prior. Last seen in February. No appt pending that she knows.  Reported goal 2-3.  HTN - blood pressure high for past few days - machine at home: 174/95,  Took an extra dose of ramipril 5mg , rechecked at pharmacy - 184/94, then 158/75 on recheck. Restarted her blood pressure medicine ramipril 5mg  on march 5th.  Has not taken ramipril this morning.   Results for orders placed during the hospital encounter of 07/14/12  CBC WITH DIFFERENTIAL      Result Value Range   WBC 3.5 (*) 4.0 - 10.5 K/uL   RBC 3.34 (*) 3.87 - 5.11 MIL/uL   Hemoglobin 9.5 (*) 12.0 - 15.0 g/dL   HCT 40.9 (*) 81.1 - 91.4 %   MCV 87.1  78.0 - 100.0 fL   MCH 28.4  26.0 - 34.0 pg   MCHC 32.6  30.0 - 36.0 g/dL   RDW 78.2 (*) 95.6 - 21.3 %   Platelets 237  150 - 400 K/uL   Neutrophils Relative 58  43 - 77 %   Neutro Abs 2.1  1.7 - 7.7 K/uL   Lymphocytes Relative 26  12 - 46 %   Lymphs Abs 0.9  0.7 - 4.0 K/uL   Monocytes Relative 10  3 - 12 %   Monocytes Absolute 0.4  0.1 - 1.0 K/uL   Eosinophils Relative 5  0 - 5 %   Eosinophils Absolute 0.2  0.0 - 0.7 K/uL   Basophils Relative 0  0 - 1 %   Basophils Absolute 0.0  0.0 - 0.1 K/uL  PROTIME-INR   Result Value Range   Prothrombin Time 24.3 (*) 11.6 - 15.2 seconds   INR 2.30 (*) 0.00 - 1.49  PROTIME-INR      Result Value Range   Prothrombin Time 27.5 (*) 11.6 - 15.2 seconds   INR 2.72 (*) 0.00 - 1.49  CBC      Result Value Range   WBC 4.1  4.0 - 10.5 K/uL   RBC 2.60 (*) 3.87 - 5.11 MIL/uL   Hemoglobin 7.3 (*) 12.0 - 15.0 g/dL   HCT 08.6 (*) 57.8 - 46.9 %   MCV 86.2  78.0 - 100.0 fL   MCH 28.1  26.0 - 34.0 pg   MCHC 32.6  30.0 - 36.0 g/dL   RDW 62.9 (*) 52.8 - 41.3 %   Platelets 187  150 - 400 K/uL  BASIC METABOLIC PANEL      Result Value Range   Sodium 138  135 - 145 mEq/L   Potassium 3.4 (*) 3.5 - 5.1 mEq/L   Chloride 104  96 - 112 mEq/L   CO2 28  19 - 32 mEq/L  Glucose, Bld 110 (*) 70 - 99 mg/dL   BUN 7  6 - 23 mg/dL   Creatinine, Ser 5.62  0.50 - 1.10 mg/dL   Calcium 8.1 (*) 8.4 - 10.5 mg/dL   GFR calc non Af Amer 85 (*) >90 mL/min   GFR calc Af Amer >90  >90 mL/min  PROTIME-INR      Result Value Range   Prothrombin Time 25.9 (*) 11.6 - 15.2 seconds   INR 2.51 (*) 0.00 - 1.49  CBC      Result Value Range   WBC 4.0  4.0 - 10.5 K/uL   RBC 3.31 (*) 3.87 - 5.11 MIL/uL   Hemoglobin 9.5 (*) 12.0 - 15.0 g/dL   HCT 13.0 (*) 86.5 - 78.4 %   MCV 85.8  78.0 - 100.0 fL   MCH 28.7  26.0 - 34.0 pg   MCHC 33.5  30.0 - 36.0 g/dL   RDW 69.6 (*) 29.5 - 28.4 %   Platelets 170  150 - 400 K/uL  POCT I-STAT 4, (NA,K, GLUC, HGB,HCT)      Result Value Range   Sodium 143  135 - 145 mEq/L   Potassium 3.2 (*) 3.5 - 5.1 mEq/L   Glucose, Bld 104 (*) 70 - 99 mg/dL   HCT 13.2 (*) 44.0 - 10.2 %   Hemoglobin 10.2 (*) 12.0 - 15.0 g/dL  TYPE AND SCREEN      Result Value Range   ABO/RH(D) AB POS     Antibody Screen NEG     Sample Expiration 07/17/2012     Unit Number [id number]     Blood Component Type RED CELLS,LR     Unit division 00     Status of Unit ISSUED,FINAL     Transfusion Status OK TO TRANSFUSE     Crossmatch Result Compatible     Unit Number [id number]      Blood Component Type RED CELLS,LR     Unit division 00     Status of Unit ISSUED,FINAL     Transfusion Status OK TO TRANSFUSE     Crossmatch Result Compatible    PREPARE RBC (CROSSMATCH)      Result Value Range   Order Confirmation ORDER PROCESSED BY BLOOD BANK    ABO/RH      Result Value Range   ABO/RH(D) AB POS       Review of Systems See above.    Denies recent nosebleed.       Objective:   Physical Exam  Vitals reviewed. Constitutional: She is oriented to person, place, and time. She appears well-developed and well-nourished.  HENT:  Head: Normocephalic and atraumatic.  Nose: No epistaxis.    Cardiovascular: Normal rate, regular rhythm and normal pulses.   Murmur heard. 2nd click.   Pulmonary/Chest: Effort normal and breath sounds normal. She has no rales.  Musculoskeletal: She exhibits edema (trace pedal. ).  Neurological: She is alert and oriented to person, place, and time.  Skin: Skin is warm and dry.  Psychiatric: She has a normal mood and affect.  manual BP 146/74.  Results for orders placed in visit on 07/25/12  POCT CBC      Result Value Range   WBC 3.4 (*) 4.6 - 10.2 K/uL   Lymph, poc 1.1  0.6 - 3.4   POC LYMPH PERCENT 30.9  10 - 50 %L   MID (cbc) 0.2  0 - 0.9   POC MID % 7.2  0 - 12 %M  POC Granulocyte 2.1  2 - 6.9   Granulocyte percent 61.9  37 - 80 %G   RBC 3.62 (*) 4.04 - 5.48 M/uL   Hemoglobin 9.9 (*) 12.2 - 16.2 g/dL   HCT, POC 45.4 (*) 09.8 - 47.9 %   MCV 92.6  80 - 97 fL   MCH, POC 27.3  27 - 31.2 pg   MCHC 29.6 (*) 31.8 - 35.4 g/dL   RDW, POC 11.9     Platelet Count, POC 281  142 - 424 K/uL   MPV 7.6  0 - 99.8 fL       Assessment & Plan:  [name] is a 72 y.o. female  Current use of long term anticoagulation - Plan: Protime-INR  Anemia, unspecified - Plan: POCT CBC  HTN (hypertension)  Epistaxis   Complicated PMH, including PAF, s/pAVR, heart valve replacement on coumadin, CAD,  HTN., and recent hospitalization  and cautery for severe bilateral epistaxis, with anemia requiring transfusion.   Epistaxis - resolved.  Had op follow up with ENT., and no recurrence of epistaxis.  Cont ENT follow up, or to ER if epistaxis returns.   Anemia - discharge hgb of 9.5 9 days ago, without recurrence of epistaxis. Improved today.  No new treatments.   Anticoagulation - for PAF, AVR, porcine valve.  Per review of notes with primary provider - she is supposed to be followed at [facility] coumadin clinic. Last notes from [place] coumadin clinic 05/11/12.  Plan on follow up in 1 week then (per notes patient insisted on 2 week follow up).  Plan at that time - 1mg  qd, except 2.5mg  MWF. Will check pt/inr, and planned to have followed up with [place] coumadin clinic this week. Discussed with pt - she does not want to do this she wants a home INR machine, or to go to [place].  Advised to discuss this with [name] on Tuesday.   HTN - some higher readings out of office, and slightly elevated systolic in office today, but difficult to assess today as took 2 doses of ramipril yesterday and none this am. Discussed risk of hypotension with overtreatment.  Blood pressures in normal range during hospitalization.  Will continue ramipril 5 mg qd (can dose QPM as last dose last night) for now, continue to keep record of outside blood pressures over next 3 days and return for recheck with [name] - can see him Tuesday 3/25 between 8 am and 2pm.   Patient Instructions  Recheck with [name] in 2 days to discuss your blood pressure, and plan for following the coumadin. Keep a record of your blood pressures outside of the office and bring them and your machine to the next office visit. Continue ramipril 5mg  - once per day. Return to the clinic or go to the nearest emergency room if any of your symptoms worsen or new symptoms occur.    spent over 40 minutes with chart and record review and discussion with patient.

## 2012-07-26 ENCOUNTER — Encounter: Payer: Self-pay | Admitting: Internal Medicine

## 2012-07-27 ENCOUNTER — Ambulatory Visit (INDEPENDENT_AMBULATORY_CARE_PROVIDER_SITE_OTHER): Payer: BC Managed Care – PPO | Admitting: Internal Medicine

## 2012-07-27 VITALS — BP 140/84 | HR 60 | Temp 98.2°F | Resp 16 | Ht 63.25 in | Wt 132.0 lb

## 2012-07-27 DIAGNOSIS — I10 Essential (primary) hypertension: Secondary | ICD-10-CM

## 2012-07-27 DIAGNOSIS — R1012 Left upper quadrant pain: Secondary | ICD-10-CM

## 2012-07-27 DIAGNOSIS — I77810 Thoracic aortic ectasia: Secondary | ICD-10-CM

## 2012-07-27 DIAGNOSIS — Z954 Presence of other heart-valve replacement: Secondary | ICD-10-CM

## 2012-07-27 DIAGNOSIS — I25810 Atherosclerosis of coronary artery bypass graft(s) without angina pectoris: Secondary | ICD-10-CM

## 2012-07-27 DIAGNOSIS — I4891 Unspecified atrial fibrillation: Secondary | ICD-10-CM

## 2012-07-27 DIAGNOSIS — Z7901 Long term (current) use of anticoagulants: Secondary | ICD-10-CM

## 2012-07-27 DIAGNOSIS — I340 Nonrheumatic mitral (valve) insufficiency: Secondary | ICD-10-CM

## 2012-07-27 DIAGNOSIS — Z5181 Encounter for therapeutic drug level monitoring: Secondary | ICD-10-CM

## 2012-07-27 DIAGNOSIS — D62 Acute posthemorrhagic anemia: Secondary | ICD-10-CM

## 2012-07-27 MED ORDER — RAMIPRIL 2.5 MG PO CAPS: 7.5000 mg | ORAL_CAPSULE | Freq: Every day | ORAL | Status: DC

## 2012-07-27 NOTE — Progress Notes (Signed)
Subjective:    Patient ID: [name], female    DOB: Oct 12, 1940, 72 y.o.   MRN: [id number]  HPIf/u Patient Active Problem List  Diagnosis  . Essential hypertension, benign-following surgery last year her blood pressure remained normal off medication until recent 8 weeks -has had blood pressures as high as 210/110 in the past month discovered while being treated for epistaxis /hasn't done rim of peripheral 5 mg daily for the past week and blood pressures range from 126/85-160/95 at home /pulse is usually in the 60s   . Atrial fibrillation-has needed to remain on Pacerone since surgery in August but this will be reviewed next month at [facility] a tremor related to this [employer] is a question about her recurrent left upper quadrant pain being related /recent pulmonary function studies were not normal but may been related to effort /her recurrent constipation should be related /there no liver function abnormalities or rise in creatinine /no edema, flushing, headaches, insomnia, appetite change, paresthesias, rashes or abnormal gait.   . 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 Mitral regurgitation repaired by surgery   . TIA (transient ischemic attack)-resolved without sequelae   . Anticoagulated on Coumadin--he continues to be a problem for her to get PT/INR done in followup on irregular basis because our Coumadin clinic and the clinic at [facility] or too far for her to reach comfortably on a regular basis /she would like to get a home machine but I think it would be a better consideration to switch to xarelto and will pose this question for her cardiologist at [facility] Dr. Versie Starks (pradexa cause GI discomfort unless this was confused with her positive H. pylori symptoms )--she also remains on aspirin daily   . CAD (coronary artery disease) of artery bypass graft  . Helicobacter pylori ab+--Dr Rhea Belton will reevaluate posttreatment soon /she continues with left upper quadrant gas pains after  eating which may be related to intermittent constipation /  . Belching  . Constipation  . Anemia, unspecified  . Esophageal dysmotility-upper GI suggested abnormal motility perhaps related to age    Prior to Admission medications   Medication Sig Start Date End Date Taking? Authorizing Provider  amiodarone (PACERONE) 200 MG tablet Take 200 mg by mouth daily.    Yes Historical Provider, MD  aspirin 81 MG tablet Take 81 mg by mouth daily.   Yes Historical Provider, MD  ergocalciferol (VITAMIN D2) 50000 UNITS capsule Take 50,000 Units by mouth once a week. Thursday   Yes Historical Provider, MD  ferrous sulfate 325 (65 FE) MG EC tablet Take 325 mg by mouth daily.   Yes Historical Provider, MD  latanoprost (XALATAN) 0.005 % ophthalmic solution Place 1 drop into both eyes at bedtime.    Yes Historical Provider, MD  ramipril (ALTACE) 5 MG capsule Take 5 mg by mouth daily.   Yes Historical Provider, MD  vitamin B-12 (CYANOCOBALAMIN) 50 MCG tablet Take 50 mcg by mouth daily.   Yes Historical Provider, MD  warfarin (COUMADIN) 1 MG tablet Take 1 mg by mouth as directed. 1.0 mg  Sunday-T-Th-Sat   Yes Historical Provider, MD  warfarin (COUMADIN) 2.5 MG tablet Take 2.5 mg by mouth daily. 2.5mg  M-W-F   Yes Historical Provider, MD  See med list for others  She continues to complain of sleep disruption by inability to breathe through nose/in the aftermath of her 3 separate treatments for epistaxis, the third being done in the OR, she continues to have dark blood discharge/she  remains on treatment for anemia post blood loss from this    Review of Systems  Constitutional: Negative for fever and chills.       She has had to change her activity due to the nosebleeds and has not allowed to be as physically active as she wants/she continues to feel like she might be losing weight by eating less--chart documents stable weight over the last year except for 4 pound loss in the last few months  Respiratory: Negative  for cough and wheezing.        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  Gastrointestinal: Negative for blood in stool.  Genitourinary: Negative for dysuria, frequency and difficulty urinating.  Neurological: Negative for numbness.  Psychiatric/Behavioral:       She is certainly distraught at her change in health circumstances over the last 2 years to 3 years/she was very active, asymptomatic, first portal hypertension medications in 2004, and over the past several months has deteriorated into a cardiac cripple who now is in a downward spiral of newly emerging medical problems she feels-she is depressed and anxious about these things       Objective:   Physical Exam BP 140/84  Pulse 60  Temp(Src) 98.2 F (36.8 C) (Oral)  Resp 16  Ht 5' 3.25" (1.607 m)  Wt 132 lb (59.875 kg)  BMI 23.19 kg/m2  SpO2 97% No acute distress Pupils equal round reactive to light and accommodation Nares with dark scabs, all clots on the right and less so left without clear/ patent nares Throat clear No nodes or thyromegaly Heart regular rate 65/holosystolic murmur along the left sternal border and into the axilla Lungs with rhonchi most pronounced at the left base posteriorly Abdomen soft/nontender/nondistended/no organomegaly or masses Extremities without edema/good peripheral pulses Mild tremor at rest in the hands      Assessment & Plan:  Anticoagulated on Coumadin - Plan: Protime-INR to be repeated in one week/she is slightly low at this point but was stopped from taking Coumadin for a week following her last ENT surgery   Essential hypertension-increase medication= ramipril to 7.5mg  and continue home blood pressure monitoring/may need consideration for secondary causes   Mitral  regurgitation-postsurgical  Anemia-post epistaxis/continuing iron and vitamin supplements  Atrial fibrillation controlled by Pacerone plus surgery-hopefully this can be discontinued soon as it may be causing numerous side effects  Long-term anticoagulation on Coumadin-this is becoming difficult to manage and she would like to consider a change to another medicine or home monitoring machine for PT/INR-to be discussed at her followup with her cardiologist in April, Dr. Versie Starks at [facility]  Gastrointestinal problems-left upper quadrant postprandial bloating/ recent treatment for H. Pylori  2 followup with Dr. Rhea Belton  Abnormal lung exam-repeat chest x-ray at [facility] for followup post surgery for pleural effusions in August

## 2012-08-01 ENCOUNTER — Other Ambulatory Visit (INDEPENDENT_AMBULATORY_CARE_PROVIDER_SITE_OTHER): Payer: BC Managed Care – PPO

## 2012-08-01 DIAGNOSIS — Z7901 Long term (current) use of anticoagulants: Secondary | ICD-10-CM

## 2012-08-01 LAB — PROTIME-INR
INR: 1.45 (ref <1.50)
Prothrombin Time: 17.4 s — ABNORMAL HIGH (ref 11.6–15.2)

## 2012-08-01 NOTE — Progress Notes (Addendum)
inr continues subtherapeutic on Coumadin 1 mg Tuesday Thursday Saturday Sunday//2.5 mg Monday Wednesday Friday Recommend increase in the dose to 2.5 mg and recheck PT/INR next Sunday as a walk-in/no OV necessary

## 2012-08-02 ENCOUNTER — Telehealth: Payer: Self-pay | Admitting: Radiology

## 2012-08-02 ENCOUNTER — Encounter: Payer: Self-pay | Admitting: Gastroenterology

## 2012-08-02 NOTE — Telephone Encounter (Signed)
Patient not theraputic on Coumadin 1mg . She is to take Coumadin 2.5mg  on M/W/F and 1mg  on other days of the week. Labs due on Sunday. Called her to advise left message for her to call me back to verify what she is taking.

## 2012-08-02 NOTE — Telephone Encounter (Signed)
Increase to 2.5mg  on  M/W/F/Su then 1 mg on other days. Patient advised by phone, and also told her will send her a mychart message, this is done.

## 2012-08-05 NOTE — Telephone Encounter (Signed)
See note 06/23/12

## 2012-08-17 ENCOUNTER — Other Ambulatory Visit: Payer: Self-pay | Admitting: *Deleted

## 2012-08-17 NOTE — Progress Notes (Signed)
Pt in this am for her H. Pylori breath Test. Pt states she followed all the prep instructions and has not taken the meds she was to avoid. Test completed w/o problems.

## 2012-08-20 ENCOUNTER — Other Ambulatory Visit: Payer: Self-pay | Admitting: Internal Medicine

## 2012-08-20 NOTE — Telephone Encounter (Signed)
Dr Merla Riches, do you want to RF coumadin, or does the coumadin clinic manage this?

## 2012-08-23 ENCOUNTER — Telehealth: Payer: Self-pay | Admitting: Radiology

## 2012-08-23 ENCOUNTER — Telehealth: Payer: Self-pay | Admitting: Internal Medicine

## 2012-08-23 NOTE — Telephone Encounter (Signed)
Patient called back, her INR was 1.9, on 08/16/12 the cardiologist wants her to continue current dose of Coumadin. She states she has taken all antibiotics for her H pylori and was told she no longer has this she wants you to know [place]

## 2012-08-23 NOTE — Telephone Encounter (Signed)
I'm not sure--she is followed at [facility] Last PT/INR I see was over 2 weeks ago and she should be monitored more frequently Make sure she doesn't run out of Coumadin, but she needs labs this week

## 2012-08-23 NOTE — Telephone Encounter (Signed)
Informed pt her H.Pylori Breath Test was negative; I do not know if Dr Rhea Belton has any advice or recommendations. She states she's glad and maybe her problems are r/t her heart ? [name] When asked if she's having any problems, she states she overeats a lot and sometimes has a lot of belching. Informed her I will call back if Dr Rhea Belton has any advice; she stated understanding.

## 2012-08-23 NOTE — Telephone Encounter (Signed)
Called her, mychart message sent also, coumadin has been sent in, we do not want her to run out.

## 2012-08-24 NOTE — Telephone Encounter (Signed)
H. 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

## 2012-08-25 NOTE — Telephone Encounter (Signed)
infromed pt of Dr Lauro Franklin findings and recommendations. Pt stated understanding and requested I EMAIL Dr Lauro Franklin comments to her; done.

## 2012-08-31 ENCOUNTER — Encounter: Payer: Self-pay | Admitting: Internal Medicine

## 2012-09-08 ENCOUNTER — Ambulatory Visit (INDEPENDENT_AMBULATORY_CARE_PROVIDER_SITE_OTHER): Payer: BC Managed Care – PPO | Admitting: Internal Medicine

## 2012-09-08 VITALS — BP 123/71 | HR 52 | Temp 97.2°F | Resp 20 | Ht 63.5 in | Wt 132.0 lb

## 2012-09-08 DIAGNOSIS — I10 Essential (primary) hypertension: Secondary | ICD-10-CM

## 2012-09-08 DIAGNOSIS — Z7901 Long term (current) use of anticoagulants: Secondary | ICD-10-CM

## 2012-09-08 DIAGNOSIS — Z5181 Encounter for therapeutic drug level monitoring: Secondary | ICD-10-CM

## 2012-09-08 DIAGNOSIS — D649 Anemia, unspecified: Secondary | ICD-10-CM

## 2012-09-08 DIAGNOSIS — K5900 Constipation, unspecified: Secondary | ICD-10-CM

## 2012-09-08 LAB — CBC WITH DIFFERENTIAL/PLATELET
Eosinophils Absolute: 0.4 10*3/uL (ref 0.0–0.7)
HCT: 29.6 % — ABNORMAL LOW (ref 36.0–46.0)
Hemoglobin: 9.2 g/dL — ABNORMAL LOW (ref 12.0–15.0)
Lymphs Abs: 0.9 10*3/uL (ref 0.7–4.0)
MCH: 26.4 pg (ref 26.0–34.0)
MCV: 85.1 fL (ref 78.0–100.0)
Monocytes Absolute: 0.4 10*3/uL (ref 0.1–1.0)
Monocytes Relative: 10 % (ref 3–12)
Neutrophils Relative %: 59 % (ref 43–77)
RBC: 3.48 MIL/uL — ABNORMAL LOW (ref 3.87–5.11)

## 2012-09-08 LAB — POCT URINALYSIS DIPSTICK
Bilirubin, UA: NEGATIVE
Glucose, UA: NEGATIVE
Leukocytes, UA: NEGATIVE
Nitrite, UA: NEGATIVE

## 2012-09-08 LAB — POCT UA - MICROSCOPIC ONLY
Mucus, UA: POSITIVE
Yeast, UA: NEGATIVE

## 2012-09-08 NOTE — Progress Notes (Signed)
  Subjective:    Patient ID: [name], female    DOB: [birth year], 72 y.o.   MRN: [id number]  HPI Patient Active Problem List   Diagnosis Date Noted  . cardiac valve repairs 01/29/2012    Priority: Medium  . Anticoagulated on Coumadin 01/29/2012    Priority: Medium  . Esophageal dysmotility 06/22/2012  . Helicobacter pylori ab+ 05/27/2012  . Belching 05/27/2012  . Constipation 05/27/2012  . Anemia, unspecified 05/27/2012  . Long term (current) use of anticoagulants 05/11/2012  . 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 CAD (coronary artery disease) of artery bypass graft 04/22/2012  . Ascending aorta dilatation 09/14/2011  . Mitral regurgitation 09/14/2011  . TIA (transient ischemic attack) 09/14/2011  . Essential hypertension, benign 06/23/2011  . Atrial fibrillation 06/23/2011  Current outpatient prescriptions:amiodarone (PACERONE) 200 MG tablet, Take 200 mg by mouth daily. , Disp: , Rfl: ;  COUMADIN 1 MG tablet, TAKE 1 TABLET BY MOUTH AT BEDTIME ON TUESDAYS,THURSDAYS,OR DIRECTED BY COUMADIN CLINIC, Disp: 30 tablet, Rfl: 0;  ramipril (ALTACE) 10 MG capsule, Take 10 mg by mouth daily., Disp: , Rfl: ;  warfarin (COUMADIN) 2.5 MG tablet, Take 2.5 mg by mouth daily. 2.5mg  M-W-F, Disp: , Rfl:   needs INR Gi issue--uses MOM and helps elimination, gas and bloating///qod enough to be OK Now problem is excess gas whenever stops this Early satiety Poor appetite hpylori retested as negative Had to d/c fe due to sxt Now OK if skips eating, uses loits of tums  10% on pacerone get: Cardiovascular: Hypotension (I.V. 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01 kg/m2 NAD-alert orient w/ lots of questions HEENT-cl Ht reg Lungs cl extr clear Ht reg ,no M Lungs cl extr no edema     Assessment & Plan:  Long term (current) use of anticoagulants Anticoagulated on Coumadin - Plan: POCT UA - Microscopic Only, POCT urinalysis dipstick, Protime-INR  Anemia, unspecified - Plan: CBC with Differential, Iron and TIBC  Constipation - Plan: Comprehensive metabolic panel, Magnesium  Essential hypertension, benign - Plan: Comprehensive metabolic panel  Hypocalcemia - Plan: Comprehensive metabolic panel, Magnesium, Phosphorus

## 2012-09-09 LAB — COMPREHENSIVE METABOLIC PANEL
Albumin: 3.7 g/dL (ref 3.5–5.2)
BUN: 17 mg/dL (ref 6–23)
CO2: 32 mEq/L (ref 19–32)
Glucose, Bld: 88 mg/dL (ref 70–99)
Sodium: 138 mEq/L (ref 135–145)
Total Bilirubin: 0.8 mg/dL (ref 0.3–1.2)
Total Protein: 7 g/dL (ref 6.0–8.3)

## 2012-09-09 LAB — PROTIME-INR: INR: 1.74 — ABNORMAL HIGH (ref <1.50)

## 2012-09-09 LAB — PHOSPHORUS: Phosphorus: 3.4 mg/dL (ref 2.3–4.6)

## 2012-09-09 LAB — MAGNESIUM: Magnesium: 2.2 mg/dL (ref 1.5–2.5)

## 2012-09-10 ENCOUNTER — Other Ambulatory Visit: Payer: Self-pay | Admitting: Internal Medicine

## 2012-09-10 NOTE — Telephone Encounter (Signed)
Dr Merla Riches, do you want to Rx pt's coumadin? I also wanted to check to see if sig needs to change d/t PT/INR results.

## 2012-09-13 MED ORDER — WARFARIN SODIUM 1 MG PO TABS: ORAL_TABLET | ORAL | Status: DC

## 2012-09-13 NOTE — Telephone Encounter (Signed)
To contin coumad 2.5 on Mon/wed/fri And increase the 1mg  by taking tues/thurs and Saturday Meds ordered this encounter  Medications  . warfarin (COUMADIN) 1 MG tablet    Sig: 1mg  on Tuesday, Thursday and Saturday    Dispense:  30 tablet    Refill:  0

## 2012-09-14 ENCOUNTER — Telehealth: Payer: Self-pay | Admitting: *Deleted

## 2012-09-14 MED ORDER — WARFARIN SODIUM 2.5 MG PO TABS: 2.5000 mg | ORAL_TABLET | Freq: Every day | ORAL | Status: DC

## 2012-09-14 NOTE — Telephone Encounter (Signed)
Meds ordered this encounter  Medications  . warfarin (COUMADIN) 2.5 MG tablet    Sig: Take 1 tablet (2.5 mg total) by mouth daily. 2.5mg  M-W-F    Dispense:  90 tablet    Refill:  1

## 2012-09-14 NOTE — Telephone Encounter (Signed)
Pt would like a rx refill coumadin 2.5mg .  She would like it in the 5mg  and cut it half or a bigger quantity of the 2.5mg  so she would not have to go to the pharmacy so much..  She has enough of the 1mg  already.

## 2012-09-15 ENCOUNTER — Telehealth: Payer: Self-pay | Admitting: Radiology

## 2012-09-15 DIAGNOSIS — D509 Iron deficiency anemia, unspecified: Secondary | ICD-10-CM

## 2012-09-15 NOTE — Telephone Encounter (Signed)
I spoke to patient about her labs, she is asking about her iron levels. She was advised by cardiology not to take the iron supplement. I asked her to call cardiology concerning this, she then indicated you advised if her iron level gets worse you can order her an iron infusion, do you want to do this. Please advise, Amy

## 2012-09-15 NOTE — Telephone Encounter (Signed)
Patient advised.

## 2012-09-15 NOTE — Telephone Encounter (Signed)
Gave pt instr's to inc coumadin and gave her results of PT/INR.

## 2012-09-15 NOTE — Telephone Encounter (Signed)
We would set up eval by Dr Myna Hidalgo who does these infusions--can I make that referral?

## 2012-09-16 NOTE — Telephone Encounter (Signed)
Yes, the referral to Dr Myna Hidalgo was made. I have called did not get answer.

## 2012-09-16 NOTE — Telephone Encounter (Signed)
Called again. Advised her to advise. She will get a call from Dr Myna Hidalgo.

## 2012-09-20 ENCOUNTER — Telehealth: Payer: Self-pay | Admitting: *Deleted

## 2012-09-20 NOTE — Telephone Encounter (Signed)
Message copied by Florene Glen on Mon Sep 20, 2012 10:15 AM ------      Message from: HUNT, [place] R      Created: Tue Jun 22, 2012  4:03 PM      Regarding: Vit B 12       Pt needs Vit B12 in 3-4 mth ------

## 2012-09-20 NOTE — Telephone Encounter (Signed)
Pt has been on oral Vit B12 therapy per February note. Called her this am and she has been to her Cardiologist at [facility] as well as her PCP. The cardiologist had her stop the B!@ and probiotic and Vit D, but she can restart now. I will call her in a couple of months to check the B12 level; pt stated understanding.

## 2012-09-21 ENCOUNTER — Telehealth: Payer: Self-pay | Admitting: Hematology & Oncology

## 2012-09-21 NOTE — Telephone Encounter (Signed)
Left pt message to call and schedule appointment °

## 2012-09-22 ENCOUNTER — Telehealth: Payer: Self-pay | Admitting: Internal Medicine

## 2012-09-22 ENCOUNTER — Telehealth: Payer: Self-pay | Admitting: Hematology & Oncology

## 2012-09-22 ENCOUNTER — Encounter: Payer: Self-pay | Admitting: Internal Medicine

## 2012-09-22 NOTE — Telephone Encounter (Signed)
error 

## 2012-09-22 NOTE — Telephone Encounter (Signed)
Left pt message to call and schedule appointment °

## 2012-09-23 ENCOUNTER — Encounter: Payer: Self-pay | Admitting: Internal Medicine

## 2012-09-23 ENCOUNTER — Telehealth: Payer: Self-pay | Admitting: Hematology & Oncology

## 2012-09-23 ENCOUNTER — Ambulatory Visit (INDEPENDENT_AMBULATORY_CARE_PROVIDER_SITE_OTHER): Payer: BC Managed Care – PPO | Admitting: Internal Medicine

## 2012-09-23 ENCOUNTER — Telehealth: Payer: Self-pay | Admitting: Gastroenterology

## 2012-09-23 VITALS — BP 130/70 | HR 64 | Ht 63.5 in | Wt 132.4 lb

## 2012-09-23 DIAGNOSIS — R6881 Early satiety: Secondary | ICD-10-CM

## 2012-09-23 DIAGNOSIS — R1013 Epigastric pain: Secondary | ICD-10-CM

## 2012-09-23 DIAGNOSIS — Z1211 Encounter for screening for malignant neoplasm of colon: Secondary | ICD-10-CM

## 2012-09-23 MED ORDER — POLYETHYLENE GLYCOL 3350 17 GM/SCOOP PO POWD: 17.0000 g | Freq: Every day | ORAL | Status: DC

## 2012-09-23 MED ORDER — LINACLOTIDE 145 MCG PO CAPS: 145.0000 ug | ORAL_CAPSULE | Freq: Every day | ORAL | Status: DC

## 2012-09-23 MED ORDER — RESTORA PO CAPS: 1.0000 | ORAL_CAPSULE | Freq: Every day | ORAL | Status: DC

## 2012-09-23 MED ORDER — PANTOPRAZOLE SODIUM 40 MG PO TBEC: 40.0000 mg | DELAYED_RELEASE_TABLET | Freq: Every day | ORAL | Status: DC

## 2012-09-23 NOTE — Telephone Encounter (Signed)
  09/23/2012    RE: [name] DOB: October 12, 1940 MRN: [id number]   Dear Dr. Aundria Rud,    We have scheduled the above patient for an endoscopic procedure. Our records show that she is on anticoagulation therapy.   Please advise as to how long the patient may come off her therapy of Warfrin prior to the procedure, which is scheduled for 09/29/2012.  Please fax back/ or route the completed form to [place] at [phone].   Sincerely,  Gayla [facility] for  Dr. Erick Blinks

## 2012-09-23 NOTE — Telephone Encounter (Signed)
Left pt message to call and schedule appointment °

## 2012-09-23 NOTE — Progress Notes (Signed)
Subjective:    Patient ID: [name], female    DOB: 29-Jul-1940, 72 y.o.   MRN: [id number]  HPI [name] is a 72 yo female with PMH of atrial fibrillation status post multiple DCCV on warfarin, hypertension, TIA, CAD, H. pylori who seen in followup. She reports she continues to have trouble with abdominal bloating and abdominal fullness. She reports this is noticeable immediately after waking in the morning and is constant in nature. She does occasionally feel left upper quadrant pain which she describes as a "air pocket". This is worse when she is lying on her right side. She has continued to use simethicone drops which she feels helps slightly. She is using TUMS after every meal which he says helps. She denies true heartburn, dysphagia or odynophagia. She continues to struggle with constipation on a near daily basis. She uses as needed milk of magnesia. She also uses MiraLax on most days. She states MiraLax softens the stool only but is not "help to push it out". She does report some mild nausea but no vomiting. She did try Align in the past and likes this, and is asking for more samples.  No fevers, or chills.  She has a history of epistaxis, but she reports none recently.  Review of Systems As per HPI, otherwise negative  Current Medications, Allergies, Past Medical History, Past Surgical History, Family History and Social History were reviewed in [employer] record.     Objective:   Physical Exam BP 130/70  Pulse 64  Ht 5' 3.5" (1.613 m)  Wt 132 lb 6.4 oz (60.056 kg)  BMI 23.08 kg/m2 Constitutional: Well-developed and well-nourished. No distress. HEENT: Normocephalic and atraumatic. Oropharynx is clear and moist. No oropharyngeal exudate. Conjunctivae are normal.  No scleral icterus. Neck: Neck supple. Trachea midline. Cardiovascular: Normal rate, regular rhythm and intact distal pulses. No M/R/G Pulmonary/chest: Effort normal and breath sounds  normal. No wheezing, rales or rhonchi. Abdominal: Soft, nontender, nondistended. Bowel sounds active throughout. There are no masses palpable. No hepatosplenomegaly. Extremities: no clubbing, cyanosis, or edema Lymphadenopathy: No cervical adenopathy noted. Neurological: Alert and oriented to person place and time. Skin: Skin is warm and dry. No rashes noted. Psychiatric: Normal mood and affect. Behavior is normal.   --Taken from GI letter, May 2012 Your colonoscopy revealed: 1. Non-thrombosed external hemorrhoids.  2. The examined portion of the ileum was normal.  3. One 7 mm polyp in the transverse colon. Resected and retrieved.  4. The examination was otherwise normal.   Your pathology report showed: A. "COLON POLYP" (ENDOSCOPIC POLYPECTOMY):  HYPERPLASTIC POLYP. NO ADENOMATOUS MUCOSA IS IDENTIFIED.   I recommend that you: 1. Repeat colonoscopy in 7-10 years for surveillance if overall health justifies continued screening.  2. Resume Coumadin (warfarin) at prior dose. Refer to managing physician for further adjustment of therapy.  3. Return to referring physician as previously scheduled.   If I may be of further assistance to you, please don't hesitate to contact me at [phone].  Sincerely,  Domenica Reamer, MD, MPH Division of Gastroenterology  [place]ase breath test -- negative, April 2014  CBC    Component Value Date/Time   WBC 3.9* 09/08/2012 1652   WBC 3.4* 07/25/2012 1140   RBC 3.48* 09/08/2012 1652   RBC 3.62* 07/25/2012 1140   HGB 9.2* 09/08/2012 1652   HGB 9.9* 07/25/2012 1140   HCT 29.6* 09/08/2012 1652   HCT 33.5* 07/25/2012 1140   PLT 241 09/08/2012 1652   MCV  85.1 09/08/2012 1652   MCV 92.6 07/25/2012 1140   MCH 26.4 09/08/2012 1652   MCH 27.3 07/25/2012 1140   MCHC 31.1 09/08/2012 1652   MCHC 29.6* 07/25/2012 1140   RDW 14.7 09/08/2012 1652   LYMPHSABS 0.9 09/08/2012 1652   MONOABS 0.4 09/08/2012 1652   EOSABS 0.4 09/08/2012 1652   BASOSABS 0.0 09/08/2012 1652     Iron/TIBC/Ferritin    Component Value Date/Time   IRON 14* 09/08/2012 1652   TIBC 368 09/08/2012 1652   FERRITIN 52.3 06/22/2012 1055   iron sat 4%     Assessment & Plan:  72 yo female with PMH of atrial fibrillation status post multiple DCCV on warfarin, hypertension, TIA, CAD, H. pylori who seen in followup.  1.  Abd fullness, LUQ pain, dyspepsia -- given the patient's symptoms, I recommended upper endoscopy. We did confirm H. pylori eradication, but I would like to rule out ongoing significant inflammation or structural abnormality. I will restart her on PPI with pantoprazole 40 mg daily. If her upper endoscopy is negative, I would likely recommend gastric emptying study. She will also resume probiotic and is given samples of Restora.  Her warfarin will need to be held for this test and we will contact her cardiologist for permission.  2. Constipation -- she is up-to-date with colorectal cancer screening, and her constipation may also be contributing to her fullness and upper abdominal symptoms. I recommended Linzess 145 mcg daily.  She can stop her MiraLax.  3.  Iron deficiency anemia -- again she is up-to-date on her colonoscopy, but this is another reason to pursue upper endoscopy as discussed in #1.  In the last several months her stools were heme-negative when checked by her PCP  Further recommendations after upper endoscopy.

## 2012-09-23 NOTE — Patient Instructions (Addendum)
You have been scheduled for an endoscopy with propofol. Please follow written instructions given to you at your visit today. If you use inhalers (even only as needed), please bring them with you on the day of your procedure. Your physician has requested that you go to www.startemmi.com and enter the access code given to you at your visit today. This web site gives a general overview about your procedure. However, you should still follow specific instructions given to you by our office regarding your preparation for the procedure.  We have sent the following medications to your pharmacy for you to pick up at your convenience: Linzess, you were given samples today and a prescription was sent to your pharmacy.  Protonix, and Dr. Rhea Belton recommends you start taking a probiotic daily.

## 2012-09-24 ENCOUNTER — Telehealth: Payer: Self-pay | Admitting: Hematology & Oncology

## 2012-09-24 ENCOUNTER — Telehealth: Payer: Self-pay | Admitting: Gastroenterology

## 2012-09-24 ENCOUNTER — Ambulatory Visit: Payer: BC Managed Care – PPO

## 2012-09-24 ENCOUNTER — Ambulatory Visit (INDEPENDENT_AMBULATORY_CARE_PROVIDER_SITE_OTHER): Payer: BC Managed Care – PPO | Admitting: Family Medicine

## 2012-09-24 VITALS — BP 124/62 | HR 62 | Temp 98.6°F | Resp 16 | Ht 64.0 in | Wt 133.2 lb

## 2012-09-24 DIAGNOSIS — I2510 Atherosclerotic heart disease of native coronary artery without angina pectoris: Secondary | ICD-10-CM

## 2012-09-24 DIAGNOSIS — R05 Cough: Secondary | ICD-10-CM

## 2012-09-24 DIAGNOSIS — R9389 Abnormal findings on diagnostic imaging of other specified body structures: Secondary | ICD-10-CM

## 2012-09-24 DIAGNOSIS — Z953 Presence of xenogenic heart valve: Secondary | ICD-10-CM

## 2012-09-24 DIAGNOSIS — R059 Cough, unspecified: Secondary | ICD-10-CM

## 2012-09-24 DIAGNOSIS — R918 Other nonspecific abnormal finding of lung field: Secondary | ICD-10-CM

## 2012-09-24 DIAGNOSIS — W57XXXA Bitten or stung by nonvenomous insect and other nonvenomous arthropods, initial encounter: Secondary | ICD-10-CM

## 2012-09-24 LAB — POCT CBC
Granulocyte percent: 74.5 %G (ref 37–80)
MCV: 83.4 fL (ref 80–97)
MID (cbc): 0.4 (ref 0–0.9)
POC Granulocyte: 4.2 (ref 2–6.9)
POC LYMPH PERCENT: 18.2 %L (ref 10–50)
Platelet Count, POC: 260 10*3/uL (ref 142–424)
RDW, POC: 15.9 %

## 2012-09-24 NOTE — Telephone Encounter (Signed)
lvm for pt saying we have heard back from Dr. Aundria Rud, and she is to hold her Warfrin 5 days prior to her procedure scheduled for 6.26.2014; and to call me back if she has any questions

## 2012-09-24 NOTE — Patient Instructions (Addendum)
Go to [facility] hospital ER for your cough and abnormal xray to help determine cause of your symptoms and abnormal chest xray. I have advised the charge nurse in the emergency room.

## 2012-09-24 NOTE — Progress Notes (Signed)
Subjective:    Patient ID: [name], female    DOB: [birth year], 73 y.o.   MRN: [id number]  HPI [name] is a 72 y.o. female Hx. complicated medical history per problem list.   Husband had cold symptoms recently, then she started 3 days ago with dry, sore throat with constant cough - dark, thick mucus. Congestion, runny nose, no shortness of breath.  Feels warm at night, but no measured fever.  Usually feels better in the morning, worse cough at night.   Tx: no otc meds, except saline nasal spray - multiple times per day as hx of epistaxis.   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 Most recent cardiologist 08/16/12 - told  Leaking mitral valve.    Also had tick on abdomen - noticed red spot - then 2 days later, increased redness, then noticed tick- removed on the 13th.  No fever, no other rash. Red spot has improved.  No bullseye.  Patient Active Problem List   Diagnosis Date Noted  . Colon cancer screening 09/23/2012  . Esophageal dysmotility 06/22/2012  . Helicobacter pylori ab+ 05/27/2012  . Belching 05/27/2012  . Constipation 05/27/2012  . Anemia, unspecified 05/27/2012  . Long term (current) use of anticoagulants 05/11/2012  . 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 CAD (coronary artery disease) of artery bypass graft 04/22/2012  . cardiac valve repairs 01/29/2012  . Anticoagulated on Coumadin 01/29/2012  . Ascending aorta dilatation 09/14/2011  . Mitral regurgitation 09/14/2011  . TIA (transient ischemic attack) 09/14/2011  . Essential hypertension, benign 06/23/2011  . Atrial fibrillation 06/23/2011   Past Medical History  Diagnosis Date  . HTN (hypertension)   . Atrial fibrillation   . TIA (transient ischemic attack)   . Tuberculosis   . Aortic dilatation   . Pneumonia   . Coronary artery disease   . Epistaxis, recurrent   . Anemia   . Glaucoma   . Heart murmur   . Ulcer    . Blood transfusion without reported diagnosis    Past Surgical History  Procedure Laterality Date  . Cesarean section    . Cardiac surgery       Bentalll 25  mm Freestyle porcine aortic valve, mitral repair 26 mm reein, tricuspid repair 25 mm rein, biatrial maze, ligation of left atrial appendage 12/26/2011  . Nasal endoscopy with epistaxis control N/A 07/14/2012    Procedure: NASAL ENDOSCOPY WITH EPISTAXIS CONTROL;  Surgeon: Melvenia Beam, MD;  Location: [facility] OR;  Service: ENT;  Laterality: N/A;  . Cardiac valve replacement     Prior to Admission medications   Medication Sig Start Date End Date Taking? Authorizing Provider  amiodarone (PACERONE) 200 MG tablet Take 200 mg by mouth daily.    Yes Historical Provider, MD  furosemide (LASIX) 20 MG tablet  08/16/12  Yes Historical Provider, MD  Linaclotide (LINZESS) 145 MCG CAPS Take 1 capsule (145 mcg total) by mouth daily. 09/23/12  Yes Beverley Fiedler, MD  pantoprazole (PROTONIX) 40 MG tablet Take 1 tablet (40 mg total) by mouth daily. 09/23/12  Yes Beverley Fiedler, MD  Probiotic Product (RESTORA) CAPS Take 1 capsule by mouth daily. 09/23/12  Yes Beverley Fiedler, MD  ramipril (ALTACE) 10 MG capsule Take 10 mg by mouth daily.   Yes Historical Provider, MD  warfarin (COUMADIN) 1 MG tablet 1mg  on Tuesday, Thursday and Saturday 09/13/12  Yes Tonye Pearson, MD  warfarin (COUMADIN) 2.5 MG tablet Take 1 tablet (2.5 mg total) by mouth daily. 2.5mg  M-W-F 09/14/12  Yes Tonye Pearson, MD   Allergies  Allergen Reactions  . Citrus Nausea And Vomiting  . Lactose Intolerance (Gi) Other (See Comments)    Other reaction  . Milk-Related Compounds   . Monosodium Glutamate Other (See Comments)    Other reaction Headache, dizziness  . Penicillins Other (See Comments)    "simply doesn't work for me"    History   Social History  . Marital Status: Married    Spouse Name: N/A    Number of Children: 1  . Years of Education: N/A   Occupational History  .  retired    Social History Main Topics  . Smoking status: Never Smoker   . Smokeless tobacco: Never Used  . Alcohol Use: No  . Drug Use: No  . Sexually Active: No   Other Topics Concern  . Not on file   Social History Narrative  . No narrative on file      Review of Systems  Constitutional: Negative for fever and chills.  HENT: Positive for congestion and rhinorrhea. Negative for trouble swallowing.   Respiratory: Positive for cough. Negative for shortness of breath.   Cardiovascular: Negative for chest pain.  Skin: Negative for rash.       Objective:   Physical Exam  Vitals reviewed. Constitutional: She is oriented to person, place, and time. She appears well-developed and well-nourished. No distress.  HENT:  Head: Atraumatic.  Right Ear: Hearing, tympanic membrane, external ear and ear canal normal.  Left Ear: Hearing, tympanic membrane, external ear and ear canal normal.  Nose: Nose normal. No nasal septal hematoma. No epistaxis.  Mouth/Throat: Oropharynx is clear and moist. No oropharyngeal exudate.  Eyes: Conjunctivae and EOM are normal. Pupils are equal, round, and reactive to light.  Cardiovascular: Normal rate, regular rhythm, normal heart sounds and intact distal pulses.   No murmur heard. Pulmonary/Chest: Effort normal. No respiratory distress. She has no wheezes. She has rhonchi (LLL>RLL coarse bs. ) in the right lower field and the left lower field. She has no rales.  Abdominal:    Musculoskeletal: She exhibits edema (le edema 1-2+, - stable per patient ).  Neurological: She is alert and oriented to person, place, and time.  Skin: Skin is warm and dry. No rash noted.  Psychiatric: She has a normal mood and affect. Her behavior is normal.   Results for orders placed in visit on 09/24/12  POCT CBC      Result Value Range   WBC 5.5  4.6 - 10.2 K/uL   Lymph, poc 1.0  0.6 - 3.4   POC LYMPH PERCENT 18.2  10 - 50 %L   MID (cbc) 0.4  0 - 0.9   POC MID % 7.3   0 - 12 %M   POC Granulocyte 4.2  2 - 6.9   Granulocyte percent 74.5  37 - 80 %G   RBC 3.81 (*) 4.04 - 5.48 M/uL   Hemoglobin 9.7 (*) 12.2 - 16.2 g/dL   HCT, POC 78.4 (*) 69.6 - 47.9 %   MCV 83.4  80 - 97 fL   MCH, POC 25.5 (*) 27 - 31.2 pg   MCHC 30.5 (*) 31.8 - 35.4 g/dL   RDW, POC 29.5     Platelet Count, POC 260  142 - 424 K/uL   MPV 7.6  0 - 99.8 fL   [facility] reading (PRIMARY) by  Dr. Neva Seat: CXR:  prior strernotomy and post surgical changes, large LLL infiltrate vs effusion, with increased air space markings on RLL, no prior images available for review.    Assessment & Plan:  [name] is a 72 y.o. female  Cough - Plan: POCT CBC, DG Chest 2 View  Tick bite  CAD (coronary artery disease)  Hx of heart valve replacement with porcine valve  Abnormal CXR   3 day hx of cough, with productive brown sputum.  Sick contact with URI,possible viral illness, but concerning CXR for loculated LLL pneumonia vs CHF/pleural effusion. Complicated hx including valve replacement - followed at [facility] prior, and prior CXR at [facility].  Most recent CXR indicated lll atelectasis, but concerning LLL markings today.  o2sat ok. Discussed need for possible CT, BNP, and with prior imaging and heart care at [facility], would recommend eval in [facility] ER for current sx's.  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  Discussed with charge nurse at [facility] ER.  stable - to ER by private vehicle, understanding expressed.   Patient Instructions  Go to [facility] hospital ER for your cough and abnormal xray to help determine cause of your symptoms and abnormal chest xray. I have advised the charge nurse in the emergency room.

## 2012-09-24 NOTE — Telephone Encounter (Signed)
Called to schedule appointment cell phone no answer or voice mail, left message on home phone

## 2012-09-25 DIAGNOSIS — J449 Chronic obstructive pulmonary disease, unspecified: Secondary | ICD-10-CM | POA: Insufficient documentation

## 2012-09-28 ENCOUNTER — Telehealth: Payer: Self-pay | Admitting: Hematology & Oncology

## 2012-09-28 ENCOUNTER — Telehealth: Payer: Self-pay

## 2012-09-28 NOTE — Telephone Encounter (Signed)
Barbara at referring aware I cannot reach Pt to schedule appointment

## 2012-09-28 NOTE — Telephone Encounter (Signed)
[name] from referring called pt is being seen at [facility], and will call if needs MD closer to home

## 2012-09-28 NOTE — Telephone Encounter (Signed)
Rick from [facility] called to report he tried to call pt several times and LMOM to CB but has been unable to reach her. I called pt and she reported that she had been hospitalized at [facility] after Dr Neva Seat sent her there. She had fluid in lung and she also received iron infusion. Pt states that she was also treated for H Pylori in hosp. She is still f/up w/[facility] because they have to drain fluid off from around her lung. They are concerned that it may be blood d/t her coumadin use. She does not need the appt at [facility] now, but will wait until they are finished w/her at [facility] and see if it is still needed. I asked pt to have [facility] send [place] the records when she goes there next. Pt agreed. She wanted me to be sure to tell Dr Neva Seat that she REALLY thanks him for taking the xray and sending her to [facility]. She feels" like she may have died if she had not gone". I called Rick back and advised that pt is seeing providers at [facility].

## 2012-09-29 ENCOUNTER — Encounter: Payer: BC Managed Care – PPO | Admitting: Internal Medicine

## 2012-10-05 ENCOUNTER — Ambulatory Visit: Payer: Self-pay | Admitting: Pharmacist

## 2012-10-05 DIAGNOSIS — Z7901 Long term (current) use of anticoagulants: Secondary | ICD-10-CM

## 2012-10-05 DIAGNOSIS — Z954 Presence of other heart-valve replacement: Secondary | ICD-10-CM

## 2012-10-05 DIAGNOSIS — I4891 Unspecified atrial fibrillation: Secondary | ICD-10-CM

## 2012-10-10 ENCOUNTER — Encounter: Payer: Self-pay | Admitting: Gastroenterology

## 2012-10-11 ENCOUNTER — Other Ambulatory Visit: Payer: Self-pay | Admitting: Gastroenterology

## 2012-10-11 MED ORDER — SUCRALFATE 1 GM/10ML PO SUSP: 1.0000 g | Freq: Three times a day (TID) | ORAL | Status: DC

## 2012-10-13 ENCOUNTER — Ambulatory Visit (INDEPENDENT_AMBULATORY_CARE_PROVIDER_SITE_OTHER): Payer: BC Managed Care – PPO | Admitting: Family Medicine

## 2012-10-13 VITALS — BP 108/65 | HR 71 | Temp 98.5°F | Resp 18 | Wt 132.0 lb

## 2012-10-13 DIAGNOSIS — I340 Nonrheumatic mitral (valve) insufficiency: Secondary | ICD-10-CM

## 2012-10-13 DIAGNOSIS — R05 Cough: Secondary | ICD-10-CM

## 2012-10-13 DIAGNOSIS — I059 Rheumatic mitral valve disease, unspecified: Secondary | ICD-10-CM

## 2012-10-13 DIAGNOSIS — J309 Allergic rhinitis, unspecified: Secondary | ICD-10-CM

## 2012-10-13 DIAGNOSIS — R059 Cough, unspecified: Secondary | ICD-10-CM

## 2012-10-13 MED ORDER — HYDROCODONE-HOMATROPINE 5-1.5 MG/5ML PO SYRP: 5.0000 mL | ORAL_SOLUTION | ORAL | Status: DC | PRN

## 2012-10-13 MED ORDER — FLUTICASONE PROPIONATE 50 MCG/ACT NA SUSP: 1.0000 | Freq: Two times a day (BID) | NASAL | Status: DC

## 2012-10-13 MED ORDER — DOXYCYCLINE HYCLATE 100 MG PO TABS: 100.0000 mg | ORAL_TABLET | Freq: Two times a day (BID) | ORAL | Status: DC

## 2012-10-13 NOTE — Patient Instructions (Addendum)
Drink plenty of fluids Get sufficient rest Begin the doxycycline 100 mg twice daily. Be cautious about sun exposure.  Use the fluticasone nose spray one spray each nostril twice daily  If worse at any time get rechecked.

## 2012-10-13 NOTE — Progress Notes (Signed)
Subjective: Patient went to see the doctors at [facility] after her last visit. I she she went over to the emergency room. Her cardiologist apparently has discontinued her Coumadin. She wondered why, and I said it probably was because the amiodarone has controlled her atrial fibrillation so well and she is at risk from the bleeds of her nose that she's had. She is concerned that her overall health and strength is gradually gone down. She did better on the Avelox a diaper on, but now has some cough recurring in the same fashion as before. She's coughing up white phlegm. She also blowing more out of her nose. They were going to do a thoracentesis on her, but after she finished the antibiotics she was told that the upset did not show enough fluid in her chest thoracentesis. She's not extremely dyspneic today.  Objective: Pleasant alert lady in no major distress. No obvious JVD. Chest is clear to percussion and auscultation. I do not sense any air-fluid level on her. Her heart has a loud murmur of the mitral regurg at the left lower sternal border to apex area moderate murmur at the upper sternal area.  Assessment:  Recurrent bronchitis History of coronary disease History of valvular disease History of aneurysm  Plan: Decided to treat with cough medications and doxycycline. Did not do a lot of testing on her since she looked good today. If she gets in all worse she is to return. Give her some Flonase for her nose. The episodes of epistaxis were when she was on Coumadin, and I think that she will tolerate this okay.

## 2012-10-15 ENCOUNTER — Ambulatory Visit: Payer: BC Managed Care – PPO

## 2012-10-15 ENCOUNTER — Ambulatory Visit (INDEPENDENT_AMBULATORY_CARE_PROVIDER_SITE_OTHER): Payer: BC Managed Care – PPO | Admitting: Internal Medicine

## 2012-10-15 ENCOUNTER — Other Ambulatory Visit: Payer: Self-pay | Admitting: Internal Medicine

## 2012-10-15 VITALS — BP 152/73 | HR 52 | Temp 98.1°F | Resp 18 | Wt 133.0 lb

## 2012-10-15 DIAGNOSIS — I5041 Acute combined systolic (congestive) and diastolic (congestive) heart failure: Secondary | ICD-10-CM

## 2012-10-15 DIAGNOSIS — R609 Edema, unspecified: Secondary | ICD-10-CM

## 2012-10-15 DIAGNOSIS — R05 Cough: Secondary | ICD-10-CM

## 2012-10-15 DIAGNOSIS — R059 Cough, unspecified: Secondary | ICD-10-CM

## 2012-10-15 LAB — COMPREHENSIVE METABOLIC PANEL
Albumin: 3.8 g/dL (ref 3.5–5.2)
BUN: 11 mg/dL (ref 6–23)
CO2: 28 mEq/L (ref 19–32)
Calcium: 8.8 mg/dL (ref 8.4–10.5)
Chloride: 102 mEq/L (ref 96–112)
Glucose, Bld: 81 mg/dL (ref 70–99)
Potassium: 4.1 mEq/L (ref 3.5–5.3)
Total Protein: 7.3 g/dL (ref 6.0–8.3)

## 2012-10-15 LAB — POCT CBC
Granulocyte percent: 47 %G (ref 37–80)
HCT, POC: 33.4 % — AB (ref 37.7–47.9)
Hemoglobin: 9.9 g/dL — AB (ref 12.2–16.2)
MCV: 82.8 fL (ref 80–97)
POC LYMPH PERCENT: 42.1 %L (ref 10–50)
RBC: 4.03 M/uL — AB (ref 4.04–5.48)

## 2012-10-15 MED ORDER — MOXIFLOXACIN HCL 400 MG PO TABS: 400.0000 mg | ORAL_TABLET | Freq: Every day | ORAL | Status: DC

## 2012-10-15 NOTE — Progress Notes (Deleted)
[facility]:  Office Visit  Chief Complaint:  Chief Complaint  Patient presents with  . Follow-up    cough    HPI: NYKIAH MA is a 72 y.o. female who complains of: Productive cough, No fever, no SOB except at night while laying down, no sweating, swelling in legs x yesterday Patient recovering from bronchitis.  Given Avelox at [facility].  Recovered then symptoms returned.  Patient was prescribed Doxycycline 2 days ago at OV for cough.  She states she is unable to take due to stomach issues.  Patient vomits after drinking.    She no longer has to take coumadin.  No nosebleeds  Past Medical History  Diagnosis Date  . HTN (hypertension)   . Atrial fibrillation   . TIA (transient ischemic attack)   . Tuberculosis   . Aortic dilatation   . Pneumonia   . Coronary artery disease   . Epistaxis, recurrent   . Anemia   . Glaucoma   . Heart murmur   . Ulcer   . Blood transfusion without reported diagnosis    Past Surgical History  Procedure Laterality Date  . Cesarean section    . Cardiac surgery       Bentalll 25  mm Freestyle porcine aortic valve, mitral repair 26 mm reein, tricuspid repair 25 mm rein, biatrial maze, ligation of left atrial appendage 12/26/2011  . Nasal endoscopy with epistaxis control N/A 07/14/2012    Procedure: NASAL ENDOSCOPY WITH EPISTAXIS CONTROL;  Surgeon: Melvenia Beam, MD;  Location: [facility] OR;  Service: ENT;  Laterality: N/A;  . Cardiac valve replacement     History   Social History  . Marital Status: Married    Spouse Name: N/A    Number of Children: 1  . Years of Education: N/A   Occupational History  . retired    Social History Main Topics  . Smoking status: Never Smoker   . Smokeless tobacco: Never Used  . Alcohol Use: No  . Drug Use: No  . Sexually Active: No   Other Topics Concern  . None   Social History Narrative  . None   Family History  Problem Relation Age of Onset  . CVA Father 35    Died 13  .  Blindness Father   . Colon cancer Neg Hx   . Stroke Mother   . Heart murmur Sister    Allergies  Allergen Reactions  . Citrus Nausea And Vomiting  . Lactose Intolerance (Gi) Other (See Comments)    Other reaction  . Milk-Related Compounds   . Monosodium Glutamate Other (See Comments)    Other reaction Headache, dizziness  . Penicillins Other (See Comments)    "simply doesn't work for me"    Prior to Admission medications   Medication Sig Start Date End Date Taking? Authorizing Provider  amiodarone (PACERONE) 200 MG tablet Take 200 mg by mouth daily.    Yes Historical Provider, MD  aspirin 81 MG tablet Take 81 mg by mouth daily.   Yes Historical Provider, MD  doxycycline (VIBRA-TABS) 100 MG tablet Take 1 tablet (100 mg total) by mouth 2 (two) times daily. 10/13/12  Yes Peyton Najjar, MD  fluticasone (FLONASE) 50 MCG/ACT nasal spray Place 1 spray into the nose 2 (two) times daily. 10/13/12  Yes Peyton Najjar, MD  furosemide (LASIX) 20 MG tablet  08/16/12  Yes Historical Provider, MD  HYDROcodone-homatropine (HYCODAN) 5-1.5 MG/5ML syrup Take 5 mLs by mouth every 4 (four)  hours as needed for cough. 10/13/12  Yes Peyton Najjar, MD  latanoprost (XALATAN) 0.005 % ophthalmic solution 1 drop at bedtime.   Yes Historical Provider, MD  Linaclotide Karlene Einstein) 145 MCG CAPS Take 1 capsule (145 mcg total) by mouth daily. 09/23/12  Yes Beverley Fiedler, MD  pantoprazole (PROTONIX) 40 MG tablet Take 1 tablet (40 mg total) by mouth daily. 09/23/12  Yes Beverley Fiedler, MD  potassium chloride (K-DUR) 10 MEQ tablet Take 10 mEq by mouth 2 (two) times daily.   Yes Historical Provider, MD  Probiotic Product (RESTORA) CAPS Take 1 capsule by mouth daily. 09/23/12  Yes Beverley Fiedler, MD  ramipril (ALTACE) 10 MG capsule Take 10 mg by mouth daily.   Yes Historical Provider, MD  sodium fluoride (LURIDE) 1.1 (0.5 F) MG/ML SOLN Take 1 drop by mouth.   Yes Historical Provider, MD  sucralfate (CARAFATE) 1 GM/10ML suspension Take  10 mLs (1 g total) by mouth 4 (four) times daily -  before meals and at bedtime. 10/11/12  Yes Beverley Fiedler, MD     ROS: The patient denies fevers, chills, night sweats, unintentional weight loss, chest pain, palpitations, wheezing, dyspnea on exertion, nausea, vomiting, abdominal pain, dysuria, hematuria, melena, numbness, weakness, or tingling. ***  All other systems have been reviewed and were otherwise negative with the exception of those mentioned in the HPI and as above.    PHYSICAL EXAM: Filed Vitals:   10/15/12 0922  BP: 152/73  Pulse: 52  Temp: 98.1 F (36.7 C)  Resp: 18   Filed Vitals:   10/15/12 0922  Weight: 133 lb (60.328 kg)   Body mass index is 22.82 kg/(m^2).  General: Alert, no acute distress HEENT:  Normocephalic, atraumatic, oropharynx patent.  Cardiovascular:  Regular rate and rhythm, no rubs murmurs or gallops.  No Carotid bruits, radial pulse intact. No pedal edema.  Respiratory: Clear to auscultation bilaterally.  No wheezes, rales, or rhonchi.  No cyanosis, no use of accessory musculature GI: No organomegaly, abdomen is soft and non-tender, positive bowel sounds.  No masses. Skin: No rashes. Neurologic: Facial musculature symmetric. Psychiatric: Patient is appropriate throughout our interaction. Lymphatic: No cervical lymphadenopathy Musculoskeletal: Gait intact.   LABS: Results for orders placed in visit on 09/24/12  POCT CBC      Result Value Range   WBC 5.5  4.6 - 10.2 K/uL   Lymph, poc 1.0  0.6 - 3.4   POC LYMPH PERCENT 18.2  10 - 50 %L   MID (cbc) 0.4  0 - 0.9   POC MID % 7.3  0 - 12 %M   POC Granulocyte 4.2  2 - 6.9   Granulocyte percent 74.5  37 - 80 %G   RBC 3.81 (*) 4.04 - 5.48 M/uL   Hemoglobin 9.7 (*) 12.2 - 16.2 g/dL   HCT, POC 69.6 (*) 29.5 - 47.9 %   MCV 83.4  80 - 97 fL   MCH, POC 25.5 (*) 27 - 31.2 pg   MCHC 30.5 (*) 31.8 - 35.4 g/dL   RDW, POC 28.4     Platelet Count, POC 260  142 - 424 K/uL   MPV 7.6  0 - 99.8 fL      EKG/XRAY:   [facility] reading (PRIMARY) by  Dr. Merla Riches:      ASSESSMENT/PLAN: No diagnosis found.

## 2012-10-15 NOTE — Patient Instructions (Addendum)
Start avelox Use carafate Take 2 20mg  lasix daily for 2 days then 1 20mg  lasix daily after that Continue potassium Recheck Monday unless [facility] is working you in

## 2012-10-15 NOTE — Progress Notes (Signed)
Subjective:    Patient ID: [name], female    DOB: [birth year], 72 y.o.   MRN: [id number]  HPI note a recent admission to [facility] after visit here for eval pl eff-- Couldn't drain due to low accumulat Tr avalox-cough/sob better Relapsed and Dr Linna Darner started doxy 48 hr ago but terrible GI intolerance Productive cough continues/no fever chills night sweats Poor po intake 48hr due to doxy Describes parox noc dysp Edema responded to 3 d lasix but worse again Fatigued Concerned that reflux has been so bad for few months  See extens prob list Current outpatient prescriptions: amiodarone (PACERONE) 200 MG tablet, Take 200 mg by mouth daily. , Disp: , Rfl: ;  aspirin 81 MG tablet, Take 81 mg by mouth daily., Disp: , Rfl: ;   doxycycline (VIBRA-TABS) 100 MG tablet, Take 1 tablet (100 mg total) by mouth 2 (two) times daily., Disp: 20 tablet, Rfl: 0;   fluticasone (FLONASE) 50 MCG/ACT nasal spray, Place 1 spray into the nose 2 (two) times daily., Disp: 16 g, Rfl: 1 furosemide (LASIX) 20 MG tablet, , Disp: , Rfl: ;not currently taking   HYDROcodone-homatropine (HYCODAN) 5-1.5 MG/5ML syrup, Take 5 mLs by mouth every 4 (four) hours as needed for cough., Disp: 120 mL, Rfl: 0;   latanoprost (XALATAN) 0.005 % ophthalmic solution, 1 drop at bedtime., Disp: , Rfl: ;   Linaclotide (LINZESS) 145 MCG CAPS, Take 1 capsule (145 mcg total) by mouth daily., Disp: 30 capsule, Rfl: 3 pantoprazole (PROTONIX) 40 MG tablet, Take 1 tablet (40 mg total) by mouth daily., Disp: 90 tablet, Rfl: 3;   potassium chloride (K-DUR) 10 MEQ tablet, Take 10 mEq by mouth 2 (two) times daily., Disp: , Rfl: ;  Probiotic Product (RESTORA) CAPS, Take 1 capsule by mouth daily., Disp: 30 capsule, Rfl: 0;  ramipril (ALTACE) 10 MG capsule, Take 10 mg by mouth daily., Disp: , Rfl:  sodium fluoride (LURIDE) 1.1 (0.5 F) MG/ML SOLN, Take 1 drop by mouth., Disp: , Rfl: ;  sucralfate (CARAFATE) 1 GM/10ML suspension, Take 10 mLs (1 g total)  by mouth 4 (four) times daily -  before meals and at bedtime., Disp: 420 mL, Rfl: 1   Review of Systems Coumadin discontinued at [facility] recently Had transfusion there as well-felt better    Objective:   Physical Exam nad--sputum thick/yellow BP 152/73  Pulse 52  Temp(Src) 98.1 F (36.7 C) (Oral)  Resp 18  Wt 133 lb (60.328 kg)  BMI 22.82 kg/m2  SpO2 98% HEENT clear Ht reg/slow at 55/loud 2-3/6 holosys M at base//no g apprec Lungs with rales over L post lungfields w/ dullness at L base Abdomen supple w/out hepatomeg or tenderness JVD to angle of jaw 2+pitting edema both LE       Results for orders placed in visit on 10/15/12  POCT CBC      Result Value Range   WBC 3.0 (*) 4.6 - 10.2 K/uL   Lymph, poc 1.3  0.6 - 3.4   POC LYMPH PERCENT 42.1  10 - 50 %L   MID (cbc) 0.3  0 - 0.9   POC MID % 10.9  0 - 12 %M   POC Granulocyte 1.4 (*) 2 - 6.9   Granulocyte percent 47.0  37 - 80 %G   RBC 4.03 (*) 4.04 - 5.48 M/uL   Hemoglobin 9.9 (*) 12.2 - 16.2 g/dL   HCT, POC 33.4 (*) 37.7 - 47.9 %   MCV 82.8  80 - 97 fL  MCH, POC 24.6 (*) 27 - 31.2 pg   MCHC 29.6 (*) 31.8 - 35.4 g/dL   RDW, POC 16.9     Platelet Count, POC 197  142 - 424 K/uL   MPV 6.7  0 - 99.8 fL  [facility] reading (PRIMARY) by  Dr. Alainna Stawicki=still cardiomeg/less effusion No other infiltrate ?cephaliz of flow  EKG shows no signs of acute injury but documents sinus bradycardia with normal AV conduction  Assessment & Plan:  Cough -secondary to acute exacerbation of chronic bronchitis and to exacerbation of congestive heart failure  plan-d/c Doxy//start avelox 400 which she responded to before  CHF (congestive heart failure), acute on chronic with edema and JVD  Plan: Brain natriuretic peptide//lasix 40 for 2 days then 20 daily plus KCl 10 daily  Re 72h  Note to Dr Stann Mainland via her myChart:I started her on daily Lasix and K+ for CHF  exacerbation/cough/PND/rales L>R/worsening perip edema/JVD/holosys M  2-3/6?increased  CXR=less effusion-continued cardiomeg  EKG sin brady w/ nl conduction thru av node  BNP pending  sputum yellow/no fever/WBC 3,000//Hgb 9--restarted avelox as well  ? Should she see you sooner than currently scheduled to consider spironolac/B-Blockers etc  Hx Hypo K+---reck   ?Amiodarone side effects//no doubt AF is now controlled >10%: Cardiovascular: Hypotension (I.V. 16%, refractory in rare cases)  Central nervous system (3% to 40%): Abnormal gait/ataxia, dizziness, fatigue, headache, malaise, impaired memory, involuntary movement, insomnia, poor coordination, peripheral neuropathy, sleep disturbances, tremor  Dermatologic: Photosensitivity (10% to 75%)  Endocrine & Metabolic: Hypothyroidism (1% to 22%)  Gastrointestinal: Nausea, vomiting, anorexia, and constipation (10% to 33%) Hepatic: AST or ALT level >2x normal (15% to 50%)  Ocular: Corneal microdeposits (>90%; causes visual disturbance in <10%) 1% to 10%:  Cardiovascular: CHF (3%), bradycardia (3% to 5%), AV block (5%), conduction abnormalities, SA node dysfunction (1% to 3%), cardiac arrhythmia, flushing, edema. Additional effects associated with I.V. administration include asystole, atrial fibrillation, cardiac arrest, electromechanical dissociation, pulseless electrical activity (PEA), ventricular tachycardia, and cardiogenic shock.  Dermatologic: Slate blue skin discoloration (<10%)  Endocrine & metabolic: Hyperthyroidism (3% to 10%; more common in iodine-deficient regions of the world), libido decreased  Gastrointestinal: Abdominal pain, abnormal salivation, abnormal taste (oral), diarrhea, nausea (I.V.)  Hematologic: Coagulation abnormalities  Hepatic: Hepatitis and cirrhosis (<3%)  Local: Phlebitis (I.V., with concentrations >3 mg/mL)  Ocular: Visual disturbances (2% to 9%), halo vision (<5% occurring especially at night), optic neuritis (1%)  Respiratory: Pulmonary toxicity has been estimated to occur at a  frequency between 2% and 7% of patients (some reports indicate a frequency as high as 17%). Toxicity may present as hypersensitivity pneumonitis; pulmonary fibrosis (cough, fever, malaise); pulmonary inflammation; interstitial pneumonitis; or alveolar pneumonitis. ARDS has been reported in up to 2% of patients receiving amiodarone, and postoperatively in patients receiving oral amiodarone.  Miscellaneous: Abnormal smell (oral)  <1% (Limited to important or life-threatening): Acute intracranial hypertension (I.V.), acute renal failure, acute respiratory distress syndrome, agranulocytosis, alopecia, anaphylactic shock, angioedema, aplastic anemia, bone marrow granuloma, bronchiolitis obliterans organizing pneumonia (BOOP), bronchospasm, cholestatic hepatitis, confusion, delirium, demyelinating polyneuropathy, disorientation, drug rash with eosinophilia and systemic symptoms (DRESS), dyspnea, encephalopathy, eczema, eosinophilic pneumonia, epididymitis (noninfectious), erectile dysfunction, erythema multiforme, exfoliative dermatitis, fever, granuloma, hallucination, hemolytic anemia, hemoptysis, hyperglycemia, hypertriglyceridemia, hypotension (oral), hypoxia, impotence, injection site reactions, leukocytoclastic vasculitis, muscle weakness, myopathy, neutropenia, optic neuropathy, pancreatitis, pancytopenia, parkinsonian symptoms, photophobia, pleural effusion, pleuritis, proarrhythmia, pruritus, pseudotumor cerebri, pulmonary alveolar hemorrhage, pulmonary edema, pulmonary infiltrates, pulmonary mass, QT interval increased, rash, renal impairment, renal insufficiency, respiratory  failure, rhabdomyolysis, SIADH, sinus arrest, skin cancer, spontaneous ecchymosis, Stevens-Johnson syndrome, thrombocytopenia, thyroid nodules, thyroid cancer, thyrotoxicosis, torsade de pointes (rare), toxic epidermal necrolysis, urticaria, vasculitis, ventricular fibrillation, wheezing

## 2012-10-18 ENCOUNTER — Ambulatory Visit (INDEPENDENT_AMBULATORY_CARE_PROVIDER_SITE_OTHER): Payer: BC Managed Care – PPO | Admitting: Internal Medicine

## 2012-10-18 VITALS — BP 128/68 | HR 59 | Temp 98.0°F | Resp 16 | Wt 133.0 lb

## 2012-10-18 DIAGNOSIS — I4891 Unspecified atrial fibrillation: Secondary | ICD-10-CM

## 2012-10-18 DIAGNOSIS — I5043 Acute on chronic combined systolic (congestive) and diastolic (congestive) heart failure: Secondary | ICD-10-CM

## 2012-10-18 DIAGNOSIS — I340 Nonrheumatic mitral (valve) insufficiency: Secondary | ICD-10-CM

## 2012-10-18 DIAGNOSIS — E559 Vitamin D deficiency, unspecified: Secondary | ICD-10-CM

## 2012-10-18 DIAGNOSIS — I059 Rheumatic mitral valve disease, unspecified: Secondary | ICD-10-CM

## 2012-10-18 DIAGNOSIS — D649 Anemia, unspecified: Secondary | ICD-10-CM

## 2012-10-18 DIAGNOSIS — D509 Iron deficiency anemia, unspecified: Secondary | ICD-10-CM

## 2012-10-18 LAB — IRON AND TIBC
%SAT: 8 % — ABNORMAL LOW (ref 20–55)
Iron: 30 ug/dL — ABNORMAL LOW (ref 42–145)
UIBC: 358 ug/dL (ref 125–400)

## 2012-10-18 LAB — VITAMIN B12: Vitamin B-12: 728 pg/mL (ref 211–911)

## 2012-10-18 NOTE — Patient Instructions (Addendum)
Reck next week Tuesday or friday

## 2012-10-18 NOTE — Progress Notes (Signed)
  Subjective:    Patient ID: [name], female    DOB: 1940-05-26, 72 y.o.   MRN: [id number]  HPIhere for f/u from 6/13 cough improved but is still productive of yellow sputum Paroxysmal nocturnal dyspnea has disappeared Continues with ankle edema but this is better Was able to walk 2 miles yesterday No fever No palpitations No chest pain Appetite has improved  Labs from 6/13 showed normal potassium/ and BNP was elevated at 331(was over 1000 a year ago) hgb remains at 9 despite IV iron at [facility] in may See labs from past evaluations--MCVs usu ok//b12 borderline per Dr Parthenia Ames couldn't tolerate b12 orally//fe also borderline//Vit d around 20 and no longer on supplements  Review of Systems Abdominal pain is better off of doxycycline She is scheduled for endoscopy on the 26th by Dr.Pyrtle She is anxious as usual and is worried about what this event means for her longevity She is also worried about her bradycardia     Objective:   Physical Exam BP 128/68  Pulse 59  Temp(Src) 98 F (36.7 C) (Oral)  Resp 16  Wt 133 lb (60.328 kg)  BMI 22.82 kg/m2  SpO2 96% No acute distress Heart regular with a rate of 58 Grade 3/6 holosystolic murmur left sternal border Lungs with rhonchi extensively over the left with some at the right base as well and mild wheezing with forced expiration/improves with coughing/dullness at the left base Edema is improved but still 2+ pitting at the ankles     Assessment & Plan:  Unspecified vitamin D deficiency  Acute exacerbation of chronic bronchitis responding to Avelox  Anemia-needing further definition--B12 folate and iron studies  Mitral regurgitation-? Whether this has progressed since May to precipitate failure--- to follow up with  Dr. Aundria [facility] at [facility]  Congestive heart failure, acute on chronic, combined--continue Lasix 40 mg a day until the ankle edema resolved and then cut to 20 mg a day  Atrial fibrillation-stable on amiodarone/?  Whether she can tolerate this much bradycardia  History of vitamin D deficiency-rechecked today  hypertension-controlled  Followup one week/sooner if worse

## 2012-10-19 ENCOUNTER — Encounter: Payer: Self-pay | Admitting: Internal Medicine

## 2012-10-21 ENCOUNTER — Encounter: Payer: BC Managed Care – PPO | Admitting: Internal Medicine

## 2012-10-23 ENCOUNTER — Encounter: Payer: Self-pay | Admitting: Internal Medicine

## 2012-10-23 ENCOUNTER — Encounter: Payer: Self-pay | Admitting: Gastroenterology

## 2012-10-23 ENCOUNTER — Telehealth: Payer: Self-pay

## 2012-10-23 NOTE — Telephone Encounter (Signed)
Patient is not any better and would like to know what to do next.  Still has cough   [phone]

## 2012-10-25 ENCOUNTER — Telehealth: Payer: Self-pay | Admitting: *Deleted

## 2012-10-25 NOTE — Telephone Encounter (Signed)
I just replied to 2 emails from her.  See chart.

## 2012-10-25 NOTE — Telephone Encounter (Signed)
Acute exacerbation of chronic bronchitis responding to Avelox, from office visit. Now cough recurring, do you want her to return? Or other, please advise.

## 2012-10-25 NOTE — Telephone Encounter (Signed)
Handled by Dr Rhea Belton.

## 2012-10-25 NOTE — Telephone Encounter (Signed)
Yes-we must be sure cough is not heart failure

## 2012-10-25 NOTE — Telephone Encounter (Signed)
Pt called this am to report she has been on an AB for Bronchitis and just finished Avelox on 10/21/12; it has not been 2 weeks and if she needs to cancel her EGD on 10/28/12, she needs to cancel by 5PM or she will have to pay a fee. Informed pt I do not know about AB and EGDs, perhaps she is confused with the H Pylori test she did. Explained if she is sick, we would not charge her a fee. Dr Rhea Belton, I think you have 2 other notes from this pt forwarded by another nurse; pt is c/o of cough. OK to continue with EGD or R/S?

## 2012-10-26 ENCOUNTER — Ambulatory Visit: Payer: BC Managed Care – PPO

## 2012-10-26 ENCOUNTER — Ambulatory Visit (INDEPENDENT_AMBULATORY_CARE_PROVIDER_SITE_OTHER): Payer: BC Managed Care – PPO | Admitting: Internal Medicine

## 2012-10-26 VITALS — BP 124/60 | HR 56 | Temp 98.0°F | Resp 18 | Ht 63.5 in | Wt 125.5 lb

## 2012-10-26 DIAGNOSIS — D649 Anemia, unspecified: Secondary | ICD-10-CM

## 2012-10-26 DIAGNOSIS — G459 Transient cerebral ischemic attack, unspecified: Secondary | ICD-10-CM

## 2012-10-26 DIAGNOSIS — K224 Dyskinesia of esophagus: Secondary | ICD-10-CM

## 2012-10-26 DIAGNOSIS — E876 Hypokalemia: Secondary | ICD-10-CM

## 2012-10-26 DIAGNOSIS — I079 Rheumatic tricuspid valve disease, unspecified: Secondary | ICD-10-CM

## 2012-10-26 DIAGNOSIS — R768 Other specified abnormal immunological findings in serum: Secondary | ICD-10-CM

## 2012-10-26 DIAGNOSIS — K5900 Constipation, unspecified: Secondary | ICD-10-CM

## 2012-10-26 DIAGNOSIS — R142 Eructation: Secondary | ICD-10-CM

## 2012-10-26 DIAGNOSIS — J441 Chronic obstructive pulmonary disease with (acute) exacerbation: Secondary | ICD-10-CM

## 2012-10-26 DIAGNOSIS — K219 Gastro-esophageal reflux disease without esophagitis: Secondary | ICD-10-CM

## 2012-10-26 DIAGNOSIS — I340 Nonrheumatic mitral (valve) insufficiency: Secondary | ICD-10-CM

## 2012-10-26 DIAGNOSIS — I509 Heart failure, unspecified: Secondary | ICD-10-CM

## 2012-10-26 DIAGNOSIS — Z8611 Personal history of tuberculosis: Secondary | ICD-10-CM

## 2012-10-26 DIAGNOSIS — I5043 Acute on chronic combined systolic (congestive) and diastolic (congestive) heart failure: Secondary | ICD-10-CM

## 2012-10-26 DIAGNOSIS — J449 Chronic obstructive pulmonary disease, unspecified: Secondary | ICD-10-CM

## 2012-10-26 LAB — COMPREHENSIVE METABOLIC PANEL
ALT: 15 U/L (ref 0–35)
CO2: 29 mEq/L (ref 19–32)
Calcium: 8.6 mg/dL (ref 8.4–10.5)
Chloride: 102 mEq/L (ref 96–112)
Sodium: 138 mEq/L (ref 135–145)
Total Bilirubin: 1 mg/dL (ref 0.3–1.2)
Total Protein: 6.6 g/dL (ref 6.0–8.3)

## 2012-10-26 LAB — POCT CBC
Hemoglobin: 9.7 g/dL — AB (ref 12.2–16.2)
MPV: 8.1 fL (ref 0–99.8)
POC Granulocyte: 2.8 (ref 2–6.9)
POC MID %: 12.7 %M — AB (ref 0–12)
RBC: 3.98 M/uL — AB (ref 4.04–5.48)

## 2012-10-26 MED ORDER — FUROSEMIDE 20 MG PO TABS: 40.0000 mg | ORAL_TABLET | Freq: Every day | ORAL | Status: DC

## 2012-10-26 MED ORDER — MOXIFLOXACIN HCL 400 MG PO TABS: 400.0000 mg | ORAL_TABLET | Freq: Every day | ORAL | Status: DC

## 2012-10-26 NOTE — Telephone Encounter (Signed)
Thanks, I have called her to advise and she will come in today to see Dr Merla Riches

## 2012-10-26 NOTE — Progress Notes (Signed)
Subjective:    Patient ID: [name], female    DOB: May 30, 1940, 72 y.o.   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 Her cough has  increased again and is productive of yellow sputum again. Worse lying down but present  during the day. No fever chills or night sweats. Baseline shortness of breath with activity he  has been attained. Note history of pulmonary functions indicating COPD.  Denies sinus symptoms or sore throat Problem #3 increasing systolic murmur suggesting mitral regurgitation/history of aortic regurgitation/history of recent cardiac surgery with valves and coronary artery bypass/history of monocular blindness post surgery which resolved(/recently started on drops to prevent glaucoma)  Has followup at [facility] with Dr. Aundria Rud on June 30-- Problem #4 continued problems gastrointestinal including belching, reflux, history of esophageal dysmotility, constipation thought secondary to iron therapy, indigestion, and weight loss.  Endoscopy to be done in 2 days/she has  begun to improve with Carafate although indicates  that more than 2 doses a day makes her stomach hurt Problem #5 anemia status post iron transfusion at [facility] 6 weeks ago with continued low hemoglobin 9.7 and low iron. She never saw Dr Myna Hidalgo for this. She could not tolerate oral iron.   She needs followup labs today to be sure she is stable for endoscopy Problem #6 weight loss 133-125 in recent  weeks//she did not make the connection with heart failure requiring diuresis to explain his weight loss/her GI issues may contribute so we will continue to follow this  Review of Systems  Constitutional:       Activity has improved  HENT: Negative for hearing loss, facial swelling and neck pain.   Eyes:       Recently saw ophthalmology and was told that there was a burning exam post surgery that caused her to have monocular vision loss but that she was recovered/day felt she was in a preglaucoma state and started her on eyedrops  Respiratory: Negative for choking, chest tightness and wheezing.   Cardiovascular: Negative for palpitations.  Gastrointestinal: Negative for diarrhea, blood in stool and abdominal distention.  Genitourinary: Negative for difficulty urinating.  Musculoskeletal: Negative for gait problem.  Skin: Negative for rash.  Hematological: Does not bruise/bleed easily.  Psychiatric/Behavioral: Negative for confusion, sleep disturbance and dysphoric mood. The patient is nervous/anxious.        Objective:   Physical Exam BP 124/60  Pulse 56  Temp(Src) 98 F (36.7 C) (Oral)  Resp 18  Ht 5' 3.5" (1.613 m)  Wt 125 lb 8 oz (56.926 kg)  BMI 21.88 kg/m2  SpO2 97% No acute distress Pupils equal round reactive to light and accommodation/EOMs conjugate Neck supple Heart regular with a rate 55 and a grade 3/6: holoSystolic murmur at the upper left sternal border  no JVD supine /edema peripherally has resolved/good peripheral pulses Lungs with dramatic rhonchi over the left posteriorly/wheezes with forced expiration there but not on the right/no consistent E-A changes on either side/there is dullness at the left base Abdomen is supple  to exam Extremities clear as noted above She is well oriented and has no problems with her memory today/cranial nerves 2-12 intact  Results for orders placed in visit on 10/26/12  POCT CBC      Result Value Range   WBC 4.7  4.6 - 10.2 K/uL    Lymph, poc 1.3  0.6 - 3.4   POC LYMPH PERCENT 27.4  10 - 50 %L   MID (cbc) 0.6  0 - 0.9   POC MID % 12.7 (*) 0 - 12 %M   POC Granulocyte 2.8  2 - 6.9   Granulocyte percent 59.9  37 - 80 %G   RBC 3.98 (*) 4.04 - 5.48 M/uL   Hemoglobin 9.7 (*) 12.2 - 16.2 g/dL   HCT, POC 40.9 (*) 81.1 - 47.9 %   MCV 81.0  80 - 97 fL   MCH, POC 24.4 (*) 27 - 31.2 pg   MCHC 30.1 (*) 31.8 - 35.4 g/dL   RDW, POC 91.4     Platelet Count, POC 219  142 - 424 K/uL   MPV 8.1  0 - 99.8 fL   [facility] reading (PRIMARY) by  Dr. Merla Riches continued resolution of the pleural effusion at the left base She has cardiomegaly with numerous postoperative changes     50 minute office visit Assessment & Plan:  Congestive heart failure, unspecified -  Plan:Comprehensive metabolic panel, Brain natriuretic peptide, furosemide (LASIX)    20 MG tablet 2 qd/continue potassium COPD exacerbation -   Respiratory or Resp and Sputum Culture, Gram stain, moxifloxacin (AVELOX) 400 MG tablet  restarted in view of upcoming endoscopy. If not cleared in 10-14 days I would suggest  CT of  the lung/consideration for sputum for AFB/cult and possibly  I- gold since TST should be  nonpredictive Hypokalemia - Comprehensive metabolic panel-adjust supplement as needed Anemia -will need another transfusion at [facility] Mitral regurgitation-the question for cardiology [facility] is whether this is the etiology of her congestive  heart failure exacerbation(has known Disease of tricuspid valve as well) Helicobacter pylori ab+/Belching/Esophageal dysmotility/Esophageal reflux--these recent GI issues  will be addressed by endoscopy later this week Recent weight loss most likely due to diuresis but will be followed  Meds ordered this encounter  Medications  . moxifloxacin (AVELOX) 400 MG tablet    Sig: Take 1 tablet (400 mg total) by mouth daily.    Dispense:  10 tablet    Refill:  0  . furosemide (LASIX) 20 MG tablet    Sig: Take 2 tablets (40 mg total) by  mouth daily.    Dispense:  60 tablet    Refill:  3   Recheck 4 wks/sooner if worse

## 2012-10-27 LAB — BRAIN NATRIURETIC PEPTIDE: Brain Natriuretic Peptide: 112.3 pg/mL — ABNORMAL HIGH (ref 0.0–100.0)

## 2012-10-28 ENCOUNTER — Ambulatory Visit (AMBULATORY_SURGERY_CENTER): Payer: BC Managed Care – PPO | Admitting: Internal Medicine

## 2012-10-28 ENCOUNTER — Encounter: Payer: Self-pay | Admitting: Internal Medicine

## 2012-10-28 VITALS — BP 131/79 | HR 55 | Temp 97.7°F | Ht 63.5 in | Wt 132.0 lb

## 2012-10-28 DIAGNOSIS — R6881 Early satiety: Secondary | ICD-10-CM

## 2012-10-28 MED ORDER — SODIUM CHLORIDE 0.9 % IV SOLN: 500.0000 mL | INTRAVENOUS | Status: DC

## 2012-10-28 NOTE — Progress Notes (Signed)
Pt procedure cancelled because pt still has pneumonia and still has chest congestion, evaluated by MD, cancelled by Dr. Rhea Belton, pt did not have sedation, removed IV, pt left center ambulatory with no problems.

## 2012-10-28 NOTE — Progress Notes (Signed)
Pt has a non-productive cough that started Sep 24, 2012.  Took antibiotics.  Then October 13, 2012 cough came back.  Pt restarted antibiotics.  Cough back again and restarted antibiotic October 26, 2012 for 3rd round of antibiotics.  Presently taking antibiotic now.  Took it yesterday.  Maw

## 2012-10-28 NOTE — Progress Notes (Signed)
Pt d/c coumadin Sep 24, 2012 by order of Dr. Versie Starks.  Only taking asa 81mg  now.  Per the pt. Maw

## 2012-10-29 ENCOUNTER — Telehealth: Payer: Self-pay | Admitting: *Deleted

## 2012-10-29 LAB — RESPIRATORY CULTURE OR RESPIRATORY AND SPUTUM CULTURE

## 2012-10-29 NOTE — Telephone Encounter (Signed)
Message copied by Florene Glen on Fri Oct 29, 2012  9:27 AM ------      Message from: Beverley Fiedler      Created: Thu Oct 28, 2012  5:05 PM       EGD canceled due to third round of antibiotics and concern for pneumonia      She has followup with her cardiologist at [facility] on the 30th      I like to see her in mid July for reassessment and to consider rescheduling EGD ------

## 2012-11-02 ENCOUNTER — Ambulatory Visit (INDEPENDENT_AMBULATORY_CARE_PROVIDER_SITE_OTHER): Payer: BC Managed Care – PPO | Admitting: Family Medicine

## 2012-11-02 ENCOUNTER — Encounter: Payer: Self-pay | Admitting: Family Medicine

## 2012-11-02 VITALS — BP 164/82 | HR 59 | Temp 98.6°F | Resp 16

## 2012-11-02 DIAGNOSIS — F411 Generalized anxiety disorder: Secondary | ICD-10-CM

## 2012-11-02 DIAGNOSIS — R42 Dizziness and giddiness: Secondary | ICD-10-CM

## 2012-11-02 LAB — POCT CBC
MCH, POC: 24.4 pg — AB (ref 27–31.2)
MCHC: 30.2 g/dL — AB (ref 31.8–35.4)
MCV: 80.5 fL (ref 80–97)
MID (cbc): 0.7 (ref 0–0.9)
POC LYMPH PERCENT: 18.2 %L (ref 10–50)
POC MID %: 13.6 %M — AB (ref 0–12)
Platelet Count, POC: 201 10*3/uL (ref 142–424)
RDW, POC: 19 %
WBC: 5 10*3/uL (ref 4.6–10.2)

## 2012-11-02 MED ORDER — CLONAZEPAM 0.5 MG PO TABS: 0.5000 mg | ORAL_TABLET | Freq: Two times a day (BID) | ORAL | Status: DC | PRN

## 2012-11-02 NOTE — Patient Instructions (Addendum)
Takes the clonazepam one at bedtime. You can take one twice daily if necessary, but minimize use for times that you were feeling very anxious and shaky. There are some other long-term antianxiety medications that we can use if you continue worried about dying.  I encourage you to speak with you pastor  Discontinue the Avelox.  If the dizziness and unsteadiness gets worse let me know.

## 2012-11-02 NOTE — Progress Notes (Signed)
Subjective: Patient is here because she feels differently tonight. She feels like she is not going to live through the night. She is anxious, scared of death. She has seen her doctor at [facility] yesterday who told her that she will not in heart failure. Today she went to [employer]. She had trouble breathing through her nose. She used the albuterol inhaler that he given her, and felt better just briefly. She then got feeling like she was not breathing well again. She felt staggery and like she was blurring out. She did not actually pass out. She wonders if she is having a stroke. She is not having a lot of other symptoms other than the nose. No cardiovascular specific symptoms. She is constipated, had to strain with a small bowel movement this morning.   Objective: Alert elderly lady known to me. HEENT: TMs are normal. Eyes PERRLA. Throat clear. Neck supple without nodes or thyromegaly. No carotid bruits. Chest clear to auscultation. Heart has a grade 4/6 systolic murmur across the precordium. It sounded regular. I did not note a true fibrillation. Abdomen soft without mass or tenderness. Extremities have 1+ to 2+ edema. She is able to stand up. She walked over to the curtain and back, though she was a little bit unsteady acting. She didn't help right up on the exam table herself. She is very anxious.  Results for orders placed in visit on 11/02/12  POCT CBC      Result Value Range   WBC 5.0  4.6 - 10.2 K/uL   Lymph, poc 0.9  0.6 - 3.4   POC LYMPH PERCENT 18.2  10 - 50 %L   MID (cbc) 0.7  0 - 0.9   POC MID % 13.6 (*) 0 - 12 %M   POC Granulocyte 3.4  2 - 6.9   Granulocyte percent 68.2  37 - 80 %G   RBC 3.49 (*) 4.04 - 5.48 M/uL   Hemoglobin 8.5 (*) 12.2 - 16.2 g/dL   HCT, POC 16.1 (*) 09.6 - 47.9 %   MCV 80.5  80 - 97 fL   MCH, POC 24.4 (*) 27 - 31.2 pg   MCHC 30.2 (*) 31.8 - 35.4 g/dL   RDW, POC 04.5     Platelet Count, POC 201  142 - 424 K/uL   MPV 7.3  0 - 99.8 fL  GLUCOSE, POCT (MANUAL RESULT  ENTRY)      Result Value Range   POC Glucose 107 (*) 70 - 99 mg/dl   Ekg:  Unchanged from several weeks ago.  NSR.   Assessment: Dizziness Anxiety  Plan: A long talk with her. Turns out her neighbor lady died a couple days ago. They have been speculating as to which would die first. She's been on Avelox for almost a month. It can cause some increase anxieties too I believe.  We'll place her on 0.5 of clonazepam once or twice daily. If she keeps having a lot of problems with anxiety consider a low dose of an SSRI.   When she laughs she walked out with a much more steady gait.

## 2012-12-16 ENCOUNTER — Encounter: Payer: Self-pay | Admitting: Internal Medicine

## 2012-12-23 ENCOUNTER — Telehealth: Payer: Self-pay | Admitting: Internal Medicine

## 2012-12-23 ENCOUNTER — Encounter: Payer: Self-pay | Admitting: Internal Medicine

## 2012-12-23 ENCOUNTER — Telehealth: Payer: Self-pay | Admitting: Gastroenterology

## 2012-12-23 DIAGNOSIS — R6881 Early satiety: Secondary | ICD-10-CM

## 2012-12-23 DIAGNOSIS — R1013 Epigastric pain: Secondary | ICD-10-CM

## 2012-12-23 MED ORDER — PANTOPRAZOLE SODIUM 40 MG PO TBEC: 40.0000 mg | DELAYED_RELEASE_TABLET | Freq: Every day | ORAL | Status: DC

## 2012-12-23 NOTE — Telephone Encounter (Signed)
Pharmacy notified us pt needs prior auth for Pantoprazole.  Faxed in prior auth form to [phone]

## 2012-12-23 NOTE — Telephone Encounter (Signed)
Sent in protonix to pt's pharmacy

## 2012-12-24 ENCOUNTER — Telehealth: Payer: Self-pay | Admitting: Gastroenterology

## 2012-12-24 ENCOUNTER — Telehealth: Payer: Self-pay | Admitting: *Deleted

## 2012-12-24 ENCOUNTER — Telehealth: Payer: Self-pay | Admitting: Internal Medicine

## 2012-12-24 NOTE — Telephone Encounter (Signed)
[facility] wanted more information for prior auth. Faxed form back to them.

## 2012-12-24 NOTE — Telephone Encounter (Signed)
Message copied by Florene Glen on Fri Oct 29, 2012 9:27 AM  ------  Message from: Beverley Fiedler  Created: Thu Oct 28, 2012 5:05 PM  EGD canceled due to third round of antibiotics and concern for pneumonia  She has followup with her cardiologist at [facility] on the 30th  I like to see her in mid July for reassessment and to consider rescheduling    See [facility] encounter

## 2012-12-24 NOTE — Telephone Encounter (Signed)
Message copied by Florene Glen on Fri Oct 29, 2012 9:27 AM  ------  Message from: Beverley Fiedler  Created: Thu Oct 28, 2012 5:05 PM  EGD canceled due to third round of antibiotics and concern for pneumonia  She has followup with her cardiologist at [facility] on the 30th  I like to see her in mid July for reassessment and to consider rescheduling

## 2012-12-24 NOTE — Telephone Encounter (Signed)
Spoke to pt. Told her I am already working on her prior [place]. She requested samples until its approved. I told her we do not have any. She said she has Nexium at home but is not that desperate to take it. I told her once I hear from her insurance co about her prior Berkley Harvey I will call her. Pt verbalized understanding.

## 2012-12-24 NOTE — Telephone Encounter (Signed)
lvm for pt letting her know her Rx for pantoprazole has been approved for one year. And to call me back to schedule a follow up office visit with Dr. Rhea Belton in September

## 2012-12-28 NOTE — Telephone Encounter (Signed)
Adonis Housekeeper, CMA is working on a repeat procedure appt in September.

## 2013-01-04 ENCOUNTER — Encounter: Payer: Self-pay | Admitting: Cardiology

## 2013-01-29 ENCOUNTER — Other Ambulatory Visit: Payer: Self-pay | Admitting: Internal Medicine

## 2013-02-06 ENCOUNTER — Other Ambulatory Visit: Payer: Self-pay | Admitting: Internal Medicine

## 2013-03-10 ENCOUNTER — Other Ambulatory Visit: Payer: Self-pay

## 2013-03-15 ENCOUNTER — Encounter: Payer: Self-pay | Admitting: Internal Medicine

## 2013-05-12 ENCOUNTER — Other Ambulatory Visit: Payer: Self-pay | Admitting: Internal Medicine

## 2013-06-13 ENCOUNTER — Other Ambulatory Visit: Payer: Self-pay | Admitting: Internal Medicine

## 2013-08-03 DIAGNOSIS — R946 Abnormal results of thyroid function studies: Secondary | ICD-10-CM | POA: Insufficient documentation

## 2013-08-13 ENCOUNTER — Encounter: Payer: Self-pay | Admitting: Internal Medicine

## 2013-08-22 IMAGING — CR DG CHEST 2V
2 series · 2 of 2 positions shown · non-contrast
Comparison: None.

CLINICAL DATA: Cough and congestion

CHEST - 2 VIEW

[lateral]
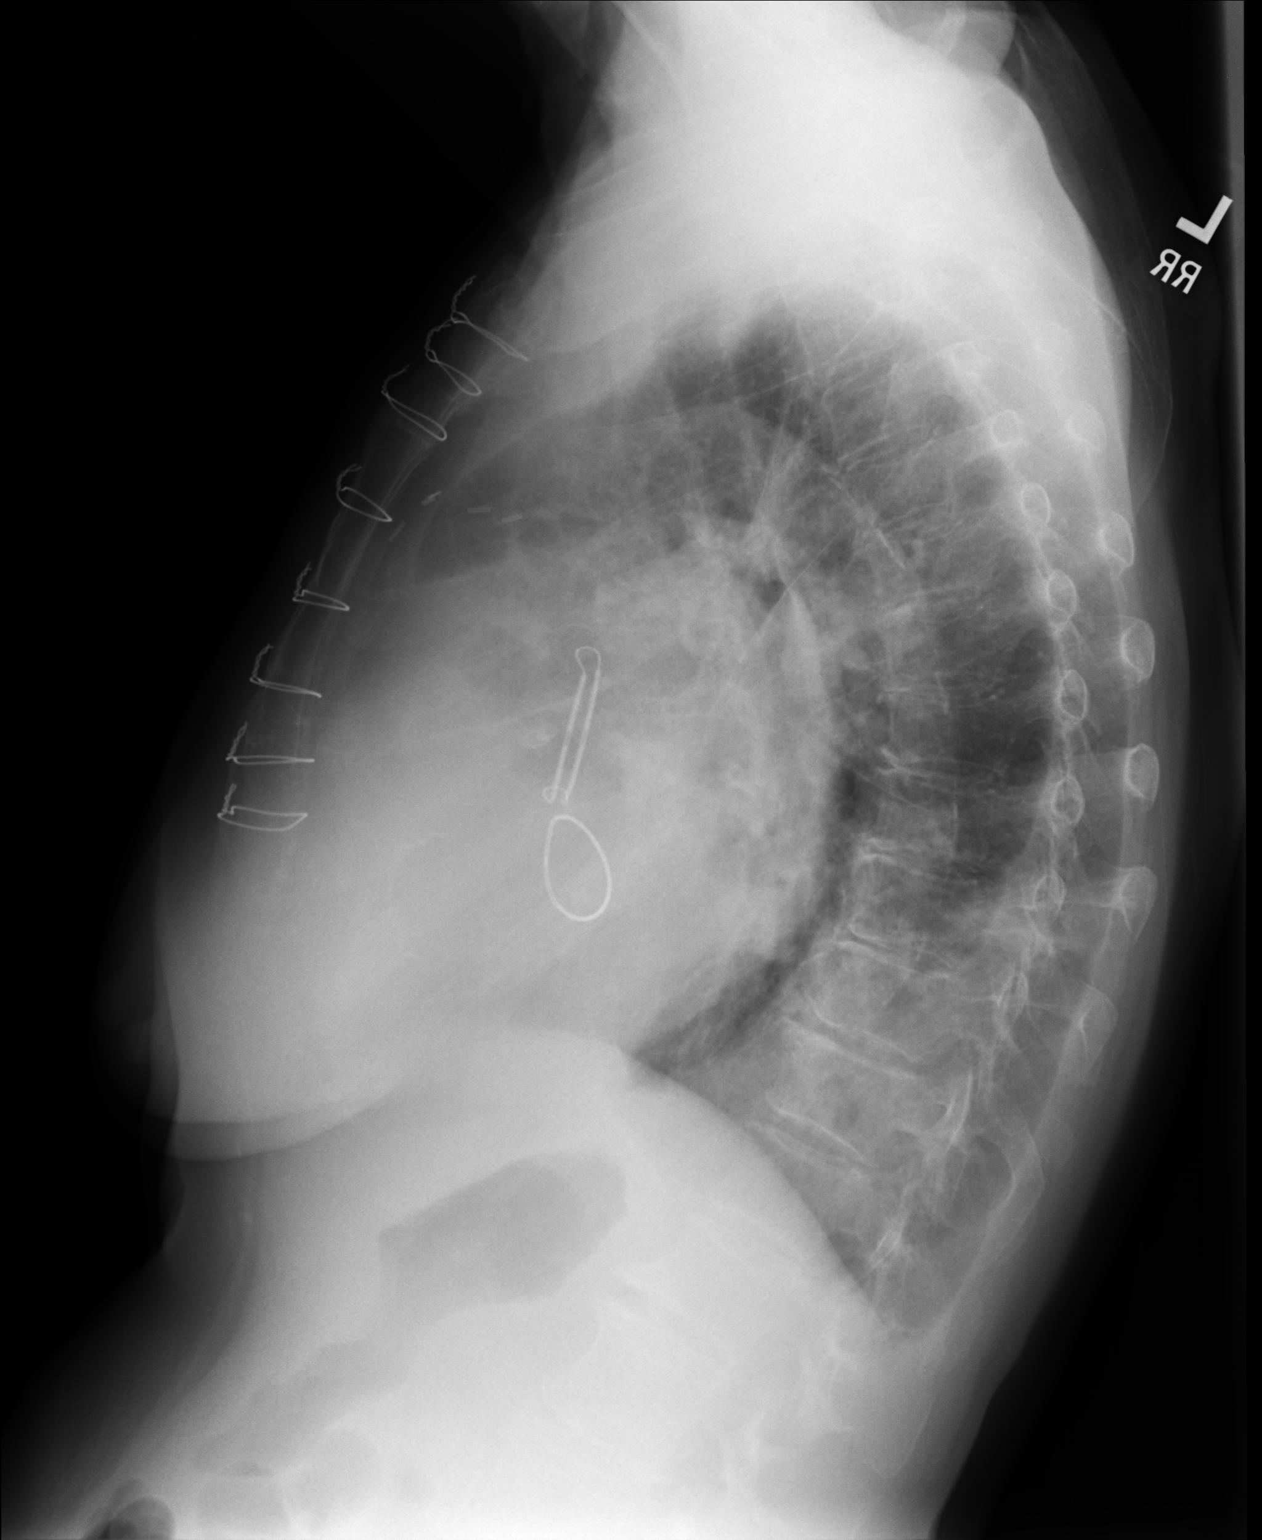

[PA]
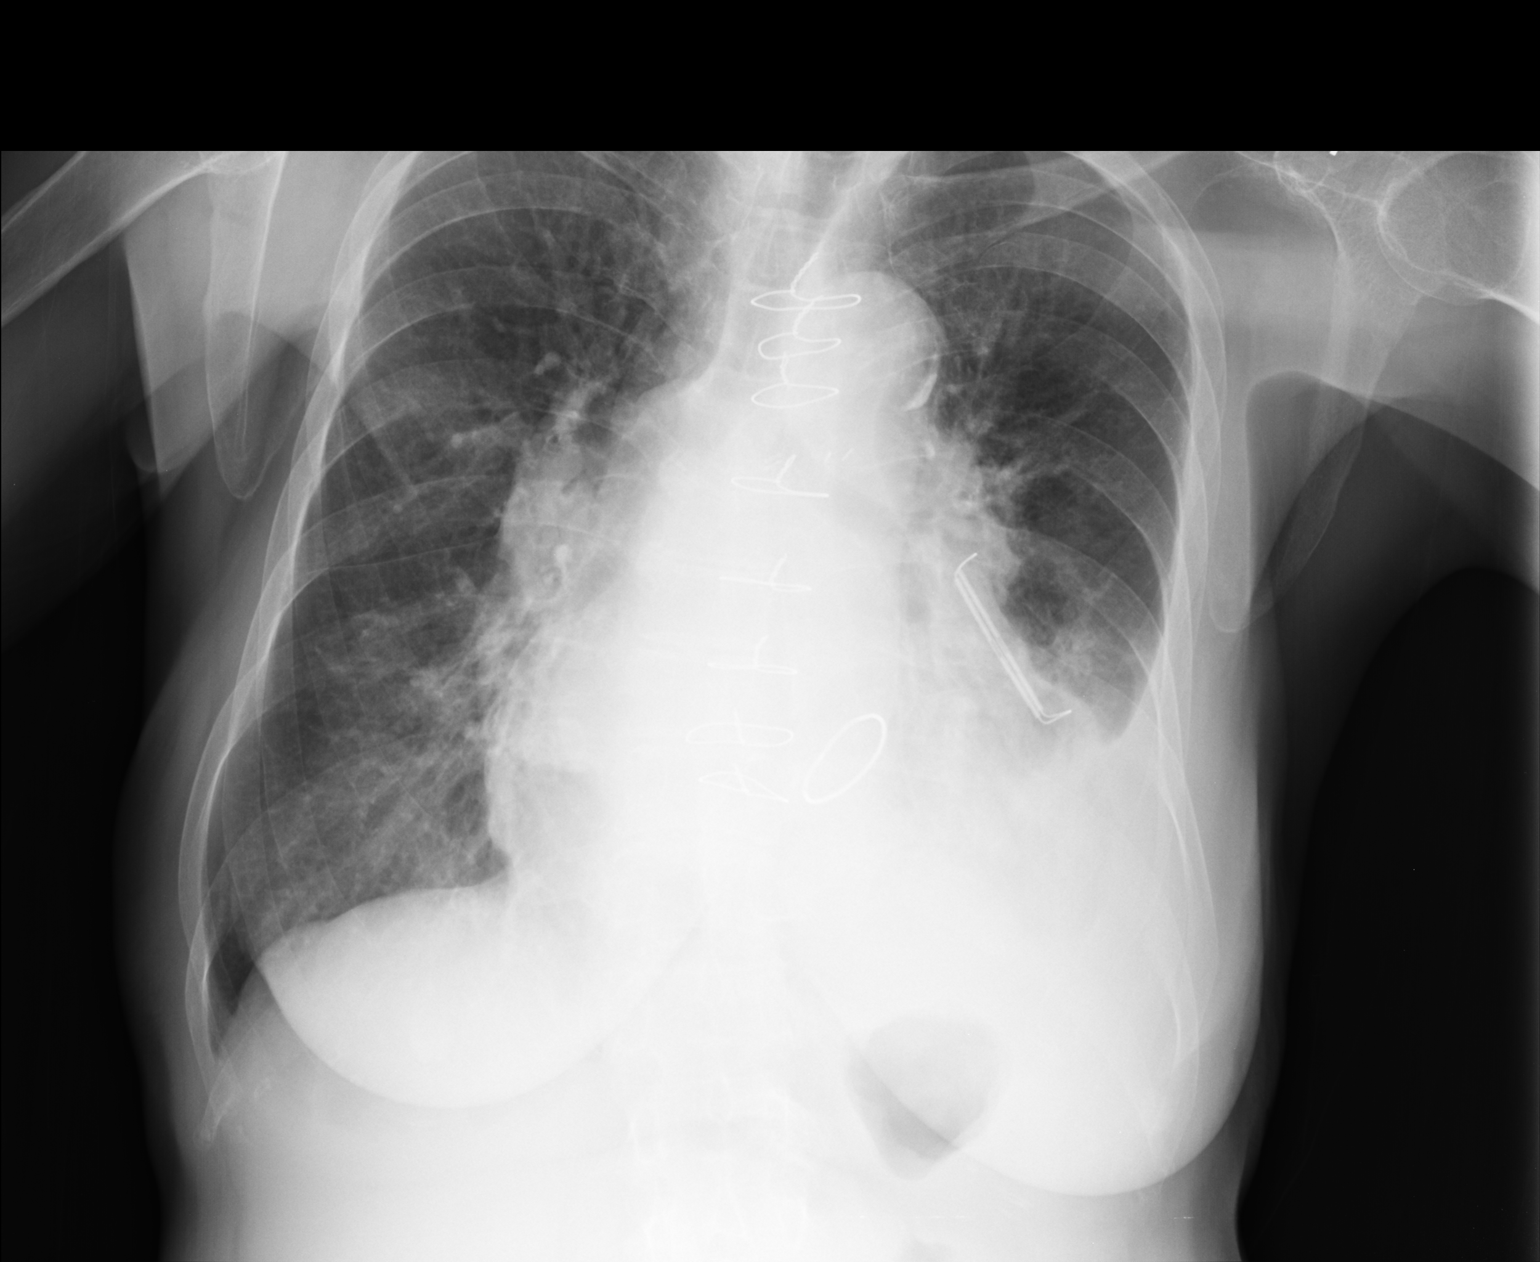

[2 of 2 positions shown; findings below may reference images not displayed]

FINDINGS: Postsurgical changes are noted.  The right lung is clear.
Cardiac shadow is mildly enlarged.  A small left-sided pleural
effusion with left basilar infiltrate is noted consistent with
acute pneumonia.  Follow-up films recommended to resolution.  No
bony abnormality is seen.
IMPRESSION: Left lower lobe infiltrate with associated small effusion.

## 2013-10-15 ENCOUNTER — Encounter: Payer: Self-pay | Admitting: Family Medicine

## 2013-11-07 ENCOUNTER — Other Ambulatory Visit: Payer: Self-pay | Admitting: Internal Medicine

## 2013-11-08 ENCOUNTER — Other Ambulatory Visit: Payer: Self-pay | Admitting: Family Medicine

## 2013-12-13 ENCOUNTER — Ambulatory Visit: Payer: BC Managed Care – PPO | Admitting: Internal Medicine

## 2013-12-21 ENCOUNTER — Ambulatory Visit (INDEPENDENT_AMBULATORY_CARE_PROVIDER_SITE_OTHER): Payer: BC Managed Care – PPO | Admitting: Family Medicine

## 2013-12-21 VITALS — BP 130/82 | HR 70 | Temp 97.9°F | Resp 16 | Ht 63.5 in | Wt 133.2 lb

## 2013-12-21 DIAGNOSIS — R5383 Other fatigue: Secondary | ICD-10-CM

## 2013-12-21 DIAGNOSIS — K3189 Other diseases of stomach and duodenum: Secondary | ICD-10-CM

## 2013-12-21 DIAGNOSIS — G43111 Migraine with aura, intractable, with status migrainosus: Secondary | ICD-10-CM

## 2013-12-21 DIAGNOSIS — F411 Generalized anxiety disorder: Secondary | ICD-10-CM

## 2013-12-21 DIAGNOSIS — I059 Rheumatic mitral valve disease, unspecified: Secondary | ICD-10-CM

## 2013-12-21 DIAGNOSIS — R05 Cough: Secondary | ICD-10-CM

## 2013-12-21 DIAGNOSIS — R5381 Other malaise: Secondary | ICD-10-CM

## 2013-12-21 DIAGNOSIS — I10 Essential (primary) hypertension: Secondary | ICD-10-CM

## 2013-12-21 DIAGNOSIS — R1013 Epigastric pain: Secondary | ICD-10-CM

## 2013-12-21 DIAGNOSIS — I340 Nonrheumatic mitral (valve) insufficiency: Secondary | ICD-10-CM

## 2013-12-21 DIAGNOSIS — G43101 Migraine with aura, not intractable, with status migrainosus: Secondary | ICD-10-CM

## 2013-12-21 DIAGNOSIS — R059 Cough, unspecified: Secondary | ICD-10-CM

## 2013-12-21 LAB — POCT CBC
Granulocyte percent: 60.5 %G (ref 37–80)
HCT, POC: 40.6 % (ref 37.7–47.9)
Hemoglobin: 12.8 g/dL (ref 12.2–16.2)
LYMPH, POC: 1.3 (ref 0.6–3.4)
MCH, POC: 29.1 pg (ref 27–31.2)
MCHC: 31.5 g/dL — AB (ref 31.8–35.4)
MCV: 92.3 fL (ref 80–97)
MID (CBC): 0.4 (ref 0–0.9)
MPV: 6.7 fL (ref 0–99.8)
PLATELET COUNT, POC: 187 10*3/uL (ref 142–424)
POC Granulocyte: 2.6 (ref 2–6.9)
POC LYMPH PERCENT: 29.5 %L (ref 10–50)
POC MID %: 10 % (ref 0–12)
RBC: 4.39 M/uL (ref 4.04–5.48)
RDW, POC: 16 %
WBC: 4.3 10*3/uL — AB (ref 4.6–10.2)

## 2013-12-21 LAB — COMPREHENSIVE METABOLIC PANEL
ALK PHOS: 59 U/L (ref 39–117)
ALT: 17 U/L (ref 0–35)
AST: 27 U/L (ref 0–37)
Albumin: 4.6 g/dL (ref 3.5–5.2)
BILIRUBIN TOTAL: 0.9 mg/dL (ref 0.2–1.2)
BUN: 22 mg/dL (ref 6–23)
CO2: 30 mEq/L (ref 19–32)
CREATININE: 0.97 mg/dL (ref 0.50–1.10)
Calcium: 9.1 mg/dL (ref 8.4–10.5)
Chloride: 100 mEq/L (ref 96–112)
Glucose, Bld: 104 mg/dL — ABNORMAL HIGH (ref 70–99)
Potassium: 4.6 mEq/L (ref 3.5–5.3)
SODIUM: 139 meq/L (ref 135–145)
TOTAL PROTEIN: 8 g/dL (ref 6.0–8.3)

## 2013-12-21 MED ORDER — HYDROCODONE-HOMATROPINE 5-1.5 MG/5ML PO SYRP: 5.0000 mL | ORAL_SOLUTION | ORAL | Status: DC | PRN

## 2013-12-21 NOTE — Patient Instructions (Addendum)
We are making a referral to a neurologist for you  Use the cough syrup sparingly  Resume getting your regular exercise in your garden. That is going to keep you healthier than just watching television would.  If the exhaustion persist please return. I will let you know the results of your labs in a few days.  Followup with your eye doctor if the blurring of vision continues. Also asked a neurologist about it when you do get in with him.  Return at any time if doing worse.  Return in 3 or 4 months for a followup visit sometime.

## 2013-12-21 NOTE — Progress Notes (Signed)
Subjective: Patient is here for many concerns. Turns out this is not just a followup. Last night she had a visual aura. She had written word and she couldn't read the word. She developed squiggly lines. She then later developed a bad headache. This frightened her considerably. She has had migraines in the past. She checked her blood pressure several times and got significantly elevated readings. The highest systolic of believe was in the 180s and the highest diastolic just over 100. She has been having problems with a chronic cough and would like some more cough medications. She's concerned about her heart because she has a mitral valve which weeks. She's had previous valve replacement of believe. She does see a cardiologist who changed her blood pressure medication to lisinopril back in June. She has been on medicine for irritable bowel and constipation, Lizness.  She says it makes to watery at times. She has been feeling excessively fatigued. She feels like she doesn't digest her food well. No urinary symptoms. She has a history of having had some strokes postoperatively after her heart surgery.. She is not having dizziness. She is having concerns about her memory not being as good as it used to be. She wishes her life could be just vaporized away without any suffering. She works in her garden about 4 hours a day, raise is about 60 different types of plan supportive a 60 garden. He stays busy with that until recently when she stopped doing much. She's been watching a lot of television. She is active a lot of other exercise. She worries about her health. She has cough, and wanted to know about her pupil diameter. She sees an eye doctor. She does not have a neurologist. She does gastroenterologist. She asked me how the people were [place] when I was there last fall. She is originally [place] and common has relatives that she worries about. Her scotoma is persisted a little bit from the migraine into this  morning.  Objective: Alert and oriented. TMs normal. Nose appears fairly normal, a little erythema in the left side where she complains of some discomfort. Eyes PERRLA. Throat clear. Neck supple without nodes thyromegaly. No carotid bruits. Chest clear. Heart has a grade 3/6 systolic murmur of mitral regurgitation at the apex. Abdomen soft without masses or tenderness this time. Extremities without edema. Skin warm and dry without lesions.  Assessment: Anxiety Malaise and fatigue Hypertension Mitral regurgitation Migraine with aura, persistent scotoma Irritable bowel syndrome Dyspepsia Chronic intermittent cough Nasal discomfort  Plan: See discharge instructions. Prescription almost one hour with her trying to answer questions. Will refer to neurology for the headaches

## 2013-12-22 ENCOUNTER — Encounter: Payer: Self-pay | Admitting: *Deleted

## 2014-03-24 ENCOUNTER — Other Ambulatory Visit: Payer: Self-pay | Admitting: Internal Medicine

## 2014-04-13 ENCOUNTER — Telehealth: Payer: Self-pay

## 2014-04-13 NOTE — Telephone Encounter (Signed)
Spoke to patient.  She had her flu shot 03/02/14.  I documented it in the chart.

## 2014-05-16 ENCOUNTER — Other Ambulatory Visit: Payer: Self-pay

## 2014-05-16 ENCOUNTER — Encounter: Payer: Self-pay | Admitting: Internal Medicine

## 2014-05-16 MED ORDER — POLYETHYLENE GLYCOL 3350 17 GM/SCOOP PO POWD: ORAL | Status: DC

## 2014-05-22 ENCOUNTER — Encounter: Payer: Self-pay | Admitting: Internal Medicine

## 2014-05-24 ENCOUNTER — Ambulatory Visit: Payer: BC Managed Care – PPO | Admitting: Internal Medicine

## 2014-06-07 ENCOUNTER — Ambulatory Visit (INDEPENDENT_AMBULATORY_CARE_PROVIDER_SITE_OTHER): Payer: BC Managed Care – PPO | Admitting: Internal Medicine

## 2014-06-07 ENCOUNTER — Encounter: Payer: Self-pay | Admitting: Internal Medicine

## 2014-06-07 VITALS — BP 114/75 | HR 71 | Temp 98.0°F | Resp 16 | Ht 64.0 in | Wt 130.0 lb

## 2014-06-07 DIAGNOSIS — I10 Essential (primary) hypertension: Secondary | ICD-10-CM

## 2014-06-07 DIAGNOSIS — J302 Other seasonal allergic rhinitis: Secondary | ICD-10-CM

## 2014-06-07 DIAGNOSIS — E032 Hypothyroidism due to medicaments and other exogenous substances: Secondary | ICD-10-CM

## 2014-06-07 DIAGNOSIS — I340 Nonrheumatic mitral (valve) insufficiency: Secondary | ICD-10-CM

## 2014-06-07 DIAGNOSIS — I480 Paroxysmal atrial fibrillation: Secondary | ICD-10-CM

## 2014-06-07 DIAGNOSIS — D509 Iron deficiency anemia, unspecified: Secondary | ICD-10-CM

## 2014-06-07 DIAGNOSIS — Z953 Presence of xenogenic heart valve: Secondary | ICD-10-CM

## 2014-06-07 DIAGNOSIS — I059 Rheumatic mitral valve disease, unspecified: Secondary | ICD-10-CM

## 2014-06-07 DIAGNOSIS — Z7901 Long term (current) use of anticoagulants: Secondary | ICD-10-CM

## 2014-06-07 DIAGNOSIS — Z23 Encounter for immunization: Secondary | ICD-10-CM

## 2014-06-07 DIAGNOSIS — R946 Abnormal results of thyroid function studies: Secondary | ICD-10-CM

## 2014-06-07 DIAGNOSIS — Z13820 Encounter for screening for osteoporosis: Secondary | ICD-10-CM

## 2014-06-07 LAB — IRON AND TIBC
%SAT: 16 % — AB (ref 20–55)
IRON: 55 ug/dL (ref 42–145)
TIBC: 334 ug/dL (ref 250–470)
UIBC: 279 ug/dL (ref 125–400)

## 2014-06-07 LAB — CBC WITH DIFFERENTIAL/PLATELET
BASOS PCT: 1 % (ref 0–1)
Basophils Absolute: 0.1 10*3/uL (ref 0.0–0.1)
EOS PCT: 12 % — AB (ref 0–5)
Eosinophils Absolute: 0.6 10*3/uL (ref 0.0–0.7)
HEMATOCRIT: 40.3 % (ref 36.0–46.0)
HEMOGLOBIN: 13.1 g/dL (ref 12.0–15.0)
Lymphocytes Relative: 29 % (ref 12–46)
Lymphs Abs: 1.5 10*3/uL (ref 0.7–4.0)
MCH: 29.9 pg (ref 26.0–34.0)
MCHC: 32.5 g/dL (ref 30.0–36.0)
MCV: 92 fL (ref 78.0–100.0)
MPV: 9.5 fL (ref 8.6–12.4)
Monocytes Absolute: 0.4 10*3/uL (ref 0.1–1.0)
Monocytes Relative: 7 % (ref 3–12)
NEUTROS ABS: 2.7 10*3/uL (ref 1.7–7.7)
NEUTROS PCT: 51 % (ref 43–77)
PLATELETS: 206 10*3/uL (ref 150–400)
RBC: 4.38 MIL/uL (ref 3.87–5.11)
RDW: 13.6 % (ref 11.5–15.5)
WBC: 5.2 10*3/uL (ref 4.0–10.5)

## 2014-06-07 LAB — COMPREHENSIVE METABOLIC PANEL
ALBUMIN: 4.2 g/dL (ref 3.5–5.2)
ALT: 20 U/L (ref 0–35)
AST: 29 U/L (ref 0–37)
Alkaline Phosphatase: 53 U/L (ref 39–117)
BUN: 19 mg/dL (ref 6–23)
CALCIUM: 9.5 mg/dL (ref 8.4–10.5)
CHLORIDE: 99 meq/L (ref 96–112)
CO2: 30 meq/L (ref 19–32)
Creat: 0.88 mg/dL (ref 0.50–1.10)
Glucose, Bld: 77 mg/dL (ref 70–99)
Potassium: 4 mEq/L (ref 3.5–5.3)
Sodium: 140 mEq/L (ref 135–145)
TOTAL PROTEIN: 7.7 g/dL (ref 6.0–8.3)
Total Bilirubin: 0.8 mg/dL (ref 0.2–1.2)

## 2014-06-07 LAB — LIPID PANEL
CHOL/HDL RATIO: 2.7 ratio
CHOLESTEROL: 196 mg/dL (ref 0–200)
HDL: 73 mg/dL (ref >39)
LDL CALC: 108 mg/dL — AB (ref 0–99)
Triglycerides: 73 mg/dL (ref <150)
VLDL: 15 mg/dL (ref 0–40)

## 2014-06-07 MED ORDER — CLONAZEPAM 0.5 MG PO TABS: 0.5000 mg | ORAL_TABLET | Freq: Two times a day (BID) | ORAL | Status: DC | PRN

## 2014-06-07 NOTE — Progress Notes (Signed)
   Subjective:    Patient ID: [name], female    DOB: 08/08/1940, 74 y.o.   MRN: [id number]  HPIf/u for first visit in over one year///her numerous problems [facility] Patient Active Problem List   Diagnosis Date Noted  . cardiac valve repairs 01/29/2012    Priority: Medium  . Chronic airway obstruction 09/25/2012  . Colon cancer screening 09/23/2012  . Personal history of drug therapy 07/13/2012  . Esophageal dysmotility 06/22/2012  . Helicobacter pylori ab+ 05/27/2012  . Belching 05/27/2012  . Constipation 05/27/2012  . Anemia, unspecified 05/27/2012  . Long term (current) use of anticoagulants-she has made a decision to use only aspirin  05/11/2012  . 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 CAD (coronary artery disease) of artery bypass graft 04/22/2012  . Aortic aneurysm 01/07/2012  . Disease of tricuspid valve 01/07/2012  . Aortic valve disorder 01/07/2012  . Cardiac complication 12/27/2011  . Cardiac arrhythmia 12/27/2011  . Congestive heart failure 12/26/2011  . Coronary atherosclerosis 12/26/2011  . Esophageal reflux---hpylori treated//off meds by diet control 12/26/2011  . Ascending aorta dilatation 09/14/2011  . Mitral regurgitation---f/u 4/16 [facility] 09/14/2011  . TIA (transient ischemic attack) 09/14/2011  . Transient cerebral ischemia 09/09/2011  . Personal history of tuberculosis 06/30/2011  . Mitral valve disorder 06/30/2011  . Essential hypertension, benign---[facility] card started lisinopril 4/15 MV leaking so wants low! and now 90/60 and episodes near syncope 06/23/2011  . Atrial fibrillation---rate contr 06/23/2011   Dr Lazarus SalinesWolicki --epistaxix/cauterizes Allergy sxtoms--lots of runny nose///Whelan's care is improving control Flu shot ok Had zostavax 1 pneumpv--- [facility] need [facility]--doesn't do/neg fam hx ? Amiodarone-induced hypothyroidism  Review of Systems No appetite or activity level  change Continues to work in garden with good energy level   no vision changes No chest pain or palpitation/rhythm feels steady most of the time GI/GU stable No new bone and joint issues  Proud of son who currently teaches part-time at [employer] and also offers music to under privileged community  Objective:   Physical Exam BP 114/75 mmHg  Pulse 71  Temp(Src) 98 F (36.7 C)  Resp 16  Ht 5\' 4"  (1.626 m)  Wt 130 lb (58.968 kg)  BMI 22.30 kg/m2  SpO2 96% HEENT clear/no thyromegaly Heart is regular/mitral murmur No edema/good peripheral pulses Neurological intact Mood very good       Assessment & Plan:  IDA (iron deficiency anemia) - Plan: CBC with Differential/Platelet, Iron and TIBC  Paroxysmal atrial fibrillation - Plan: Comprehensive metabolic panel, Lipid panel  Hx of heart valve replacement with porcine valve - Plan: Lipid panel  Mitral valve disorder  Essential hypertension - Plan: Comprehensive metabolic panel, Lipid panel  Other seasonal allergic rhinitis  Hypothyroidism due to medication - Plan: TSH, T4, free  Need for prophylactic vaccination against Streptococcus pneumoniae (pneumococcus) - Plan: Pneumococcal conjugate vaccine 13-valent IM  Essential hypertension, benign  Mitral regurgitation  Long term current use of anticoagulant therapy  Abnormal finding on thyroid function test  Occasional anxiety  DEXA needed  Meds ordered this encounter  Medications  . clonazePAM (KLONOPIN) 0.5 MG tablet    Sig: Take 1 tablet (0.5 mg total) by mouth 2 (two) times daily as needed for anxiety.    Dispense:  20 tablet    Refill:  1   follow-up 3-6 months depending on labs

## 2014-06-08 LAB — T4, FREE: Free T4: 1.51 ng/dL (ref 0.80–1.80)

## 2014-06-08 LAB — TSH: TSH: 1.449 u[IU]/mL (ref 0.350–4.500)

## 2014-09-30 ENCOUNTER — Ambulatory Visit: Payer: BC Managed Care – PPO

## 2014-09-30 ENCOUNTER — Ambulatory Visit (INDEPENDENT_AMBULATORY_CARE_PROVIDER_SITE_OTHER): Payer: BC Managed Care – PPO | Admitting: Family Medicine

## 2014-09-30 ENCOUNTER — Ambulatory Visit (INDEPENDENT_AMBULATORY_CARE_PROVIDER_SITE_OTHER): Payer: BC Managed Care – PPO

## 2014-09-30 VITALS — BP 120/70 | HR 76 | Temp 98.1°F | Ht 63.0 in | Wt 136.4 lb

## 2014-09-30 DIAGNOSIS — R42 Dizziness and giddiness: Secondary | ICD-10-CM | POA: Diagnosis not present

## 2014-09-30 DIAGNOSIS — Z8679 Personal history of other diseases of the circulatory system: Secondary | ICD-10-CM | POA: Diagnosis not present

## 2014-09-30 DIAGNOSIS — R0602 Shortness of breath: Secondary | ICD-10-CM

## 2014-09-30 LAB — BASIC METABOLIC PANEL
BUN: 13 mg/dL (ref 6–23)
CHLORIDE: 100 meq/L (ref 96–112)
CO2: 31 mEq/L (ref 19–32)
Calcium: 9.8 mg/dL (ref 8.4–10.5)
Creat: 1.14 mg/dL — ABNORMAL HIGH (ref 0.50–1.10)
Glucose, Bld: 93 mg/dL (ref 70–99)
Potassium: 4.4 mEq/L (ref 3.5–5.3)
SODIUM: 138 meq/L (ref 135–145)

## 2014-09-30 LAB — BRAIN NATRIURETIC PEPTIDE: BRAIN NATRIURETIC PEPTIDE: 178.6 pg/mL — AB (ref 0.0–100.0)

## 2014-09-30 NOTE — Patient Instructions (Addendum)
If you know certain foods are going to cause her stomach problems, try to avoid them  Suggest taking the lisinopril in the evening rather than in the morning.  If you know you're going to be working in the hot sun try taking just a half of a lisinopril or just taking one of the 2 furosemide to try and keep you from having the dizziness episodes when you raise up.  Continue trying to be active and doing your regular indoor and outdoor activities.  I'll let you know the results of your labs in a few days.  Continue using your allergy medications if needed

## 2014-09-30 NOTE — Progress Notes (Signed)
  Subjective:  Patient ID: [name], female    DOB: 03/17/1941  Age: 74 y.o. MRN: [id number]  74 year old [place] lady who comes in here with history of some shortness of breath and dizziness. She's had primarily orthostatic dizziness in the garden at times. Today she got up from her chair at breakfast table and got dizzy. She decided she should come in and get checked. She had seen somewhere that the life expectancy of someone with CHF was very short, less than 1 year, and she had seen that that was one of her diagnostic labels in the past. She [facility] who follows her for the atrial fibrillation. She had extensive surgery of her heart a few years ago with replacement of aortic and tricuspid and mitral valves I believe. She still has some mitral regurgitation. She is able to work in the garden up to 6 hours or so a cloudy day, doesn't tolerate the son well. She worries about politics. No other major specific symptoms. No headaches. She does get short of breath. No chest pains. No GI or GU complaints  Had a long talk with her. She just worries a lot and worries about when she is going to die and worries about her communication with her son because of cross cultural issues. Objective:   Pleasant lady alert and oriented. Throat clear. TMs normal. Eyes PERRLA. No carotid bruits. Chest is clear. Short systolic murmur mostly in the mitral area. Abdomen soft without mass or tenderness. No ankle edema.  [facility] reading (PRIMARY) by  Dr. Alwyn RenHopper Mild cardiomegaly without congestive changes and with obvious evidence of old surgical work on the heart  EKG without acute changes, nonspecific T-wave inversions in V2 Assessment & Plan:   Assessment: ASHD and valvular heart disease Orthostatic dizziness  Plan: Patient Instructions  If you know certain foods are going to cause her stomach problems, try to avoid them  Suggest taking the lisinopril in the evening  rather than in the morning.  If you know you're going to be working in the hot sun try taking just a half of a lisinopril or just taking one of the 2 furosemide to try and keep you from having the dizziness episodes when you raise up.  Continue trying to be active and doing your regular indoor and outdoor activities.  I'll let you know the results of your labs in a few days.  Continue using your allergy medications if needed     HOPPER,DAVID, MD 09/30/2014

## 2014-10-01 ENCOUNTER — Encounter: Payer: Self-pay | Admitting: Family Medicine

## 2014-10-05 ENCOUNTER — Encounter: Payer: Self-pay | Admitting: Family Medicine

## 2014-10-07 ENCOUNTER — Encounter: Payer: Self-pay | Admitting: Internal Medicine

## 2014-10-07 ENCOUNTER — Encounter: Payer: Self-pay | Admitting: Family Medicine

## 2014-10-07 ENCOUNTER — Telehealth: Payer: Self-pay | Admitting: Radiology

## 2014-10-07 NOTE — Telephone Encounter (Signed)
Dr Alwyn RenHopper- I gave pt lab results but she was very anxious about this. I told her I would let you answer some of her questions, as she had a lot of them. Also a language barrier. She wants to know the mild, moderate, and high levels of the BNP. I was on the phone with her for almost 9 minutes. She said you could just "write her an email".

## 2015-04-02 ENCOUNTER — Encounter: Payer: Self-pay | Admitting: Internal Medicine

## 2015-07-18 ENCOUNTER — Ambulatory Visit (INDEPENDENT_AMBULATORY_CARE_PROVIDER_SITE_OTHER): Payer: BC Managed Care – PPO | Admitting: Internal Medicine

## 2015-07-18 ENCOUNTER — Encounter: Payer: Self-pay | Admitting: Internal Medicine

## 2015-07-18 VITALS — BP 119/76 | HR 65 | Temp 98.4°F | Resp 16 | Ht 63.0 in | Wt 141.0 lb

## 2015-07-18 DIAGNOSIS — D649 Anemia, unspecified: Secondary | ICD-10-CM

## 2015-07-18 DIAGNOSIS — Z0000 Encounter for general adult medical examination without abnormal findings: Secondary | ICD-10-CM

## 2015-07-18 DIAGNOSIS — I10 Essential (primary) hypertension: Secondary | ICD-10-CM | POA: Diagnosis not present

## 2015-07-18 DIAGNOSIS — R05 Cough: Secondary | ICD-10-CM

## 2015-07-18 DIAGNOSIS — R059 Cough, unspecified: Secondary | ICD-10-CM

## 2015-07-18 LAB — LIPID PANEL
CHOL/HDL RATIO: 2.9 ratio (ref <=5.0)
CHOLESTEROL: 177 mg/dL (ref 125–200)
HDL: 62 mg/dL (ref >=46)
LDL Cholesterol: 91 mg/dL (ref <130)
TRIGLYCERIDES: 121 mg/dL (ref <150)
VLDL: 24 mg/dL (ref <30)

## 2015-07-18 LAB — CBC WITH DIFFERENTIAL/PLATELET
Basophils Absolute: 0 10*3/uL (ref 0.0–0.1)
Basophils Relative: 1 % (ref 0–1)
Eosinophils Absolute: 0.6 10*3/uL (ref 0.0–0.7)
Eosinophils Relative: 14 % — ABNORMAL HIGH (ref 0–5)
HEMATOCRIT: 39.4 % (ref 36.0–46.0)
Hemoglobin: 12.8 g/dL (ref 12.0–15.0)
LYMPHS ABS: 2 10*3/uL (ref 0.7–4.0)
LYMPHS PCT: 44 % (ref 12–46)
MCH: 29.9 pg (ref 26.0–34.0)
MCHC: 32.5 g/dL (ref 30.0–36.0)
MCV: 92.1 fL (ref 78.0–100.0)
MONOS PCT: 12 % (ref 3–12)
MPV: 9 fL (ref 8.6–12.4)
Monocytes Absolute: 0.6 10*3/uL (ref 0.1–1.0)
NEUTROS PCT: 29 % — AB (ref 43–77)
Neutro Abs: 1.3 10*3/uL — ABNORMAL LOW (ref 1.7–7.7)
PLATELETS: 196 10*3/uL (ref 150–400)
RBC: 4.28 MIL/uL (ref 3.87–5.11)
RDW: 12.9 % (ref 11.5–15.5)
WBC: 4.6 10*3/uL (ref 4.0–10.5)

## 2015-07-18 LAB — COMPREHENSIVE METABOLIC PANEL
ALBUMIN: 4.3 g/dL (ref 3.6–5.1)
ALK PHOS: 65 U/L (ref 33–130)
ALT: 13 U/L (ref 6–29)
AST: 21 U/L (ref 10–35)
BUN: 13 mg/dL (ref 7–25)
CALCIUM: 9.4 mg/dL (ref 8.6–10.4)
CO2: 31 mmol/L (ref 20–31)
Chloride: 100 mmol/L (ref 98–110)
Creat: 0.99 mg/dL — ABNORMAL HIGH (ref 0.60–0.93)
Glucose, Bld: 82 mg/dL (ref 65–99)
POTASSIUM: 4.1 mmol/L (ref 3.5–5.3)
Sodium: 137 mmol/L (ref 135–146)
TOTAL PROTEIN: 7.6 g/dL (ref 6.1–8.1)
Total Bilirubin: 0.9 mg/dL (ref 0.2–1.2)

## 2015-07-18 MED ORDER — FLUTICASONE-SALMETEROL 100-50 MCG/DOSE IN AEPB: 1.0000 | INHALATION_SPRAY | Freq: Two times a day (BID) | RESPIRATORY_TRACT | Status: DC

## 2015-07-18 MED ORDER — CLONAZEPAM 0.5 MG PO TABS: 0.5000 mg | ORAL_TABLET | Freq: Two times a day (BID) | ORAL | Status: AC | PRN

## 2015-07-18 MED ORDER — HYDROCODONE-HOMATROPINE 5-1.5 MG/5ML PO SYRP: 5.0000 mL | ORAL_SOLUTION | Freq: Three times a day (TID) | ORAL | Status: AC | PRN

## 2015-07-18 NOTE — Progress Notes (Signed)
Subjective:    Patient ID: [name], female    DOB: 09-Jun-1940, 75 y.o.   MRN: [id number]  HPIf/u routine as she gets a lot of her care at [facility] With regard to cardiac pulmonary And ophthalmologic issues//she needs a few routine labs here--see last visit at [facility] in November with laboratories there good. Doing very well in general.She has developed new outlook on life which has controlled her anxiety over her medical problems which were causing many psychosomatic issues. She is relatively happy and able to continue gardening. She had her husband have reached a decision about discontinuing the complete support of their 76 year old who has now achieved bankruptcy despite several advanced degrees and expects them to bail him out yet again. She feels a bit guilty but resolved. Patient Active Problem List   Diagnosis Date Noted  . cardiac valve repairs 01/29/2012    Priority: Medium  . Abnormal finding on thyroid function test 08/03/2013  . Chronic airway obstruction ([place]) 09/25/2012  . Colon cancer screening 09/23/2012  . Personal history of drug therapy 07/13/2012  . Esophageal dysmotility 06/22/2012  . Helicobacter pylori ab+ 05/27/2012  . Belching 05/27/2012  . Constipation 05/27/2012  . Anemia, unspecified 05/27/2012  . Long term current use of anticoagulant therapy 05/11/2012  . 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 CAD (coronary artery disease) of artery bypass graft 04/22/2012  . Aortic aneurysm ([place]) 01/07/2012  . Disease of tricuspid valve 01/07/2012  . Aortic valve disorder 01/07/2012  . Cardiac complication 04/27/8249  . Cardiac arrhythmia 12/27/2011  . Congestive heart failure ([place]) 12/26/2011  . Coronary atherosclerosis 12/26/2011  . Esophageal reflux 12/26/2011  . Ascending aorta dilatation (HCC) 09/14/2011  . Mitral regurgitation 09/14/2011  . TIA (transient ischemic attack) 09/14/2011  . Transient cerebral ischemia 09/09/2011  . Personal history of  tuberculosis 06/30/2011  . Mitral valve disorder 06/30/2011  . Essential hypertension, benign 06/23/2011  . Atrial fibrillation ([place]) 06/23/2011   Outpatient Encounter Prescriptions as of 07/18/2015  Medication Sig  . amiodarone (PACERONE) 200 MG tablet Take 200 mg by mouth daily.   Marland Kitchen aspirin 81 MG tablet Take 81 mg by mouth daily.  . clonazePAM (KLONOPIN) 0.5 MG tablet Take 1 tablet (0.5 mg total) by mouth 2 (two) times daily as needed for anxiety.  . DENTA 5000 PLUS 1.1 % CREA dental cream   . Fluticasone-Salmeterol (ADVAIR DISKUS) 100-50 MCG/DOSE AEPB Inhale 1 puff into the lungs every 12 (twelve) hours.  . furosemide (LASIX) 20 MG tablet Take 2 tablets (40 mg total) by mouth daily.  Marland Kitchen latanoprost (XALATAN) 0.005 % ophthalmic solution 1 drop at bedtime.  Marland Kitchen lisinopril (PRINIVIL,ZESTRIL) 40 MG tablet Take 40 mg by mouth daily.  . Multiple Vitamin (MULTIVITAMIN) tablet Take 1 tablet by mouth daily.  . potassium chloride (K-DUR) 10 MEQ tablet Take 10 mEq by mouth 2 (two) times daily.    Colonoscopy [facility] 2012 Health maintenance issues up-to-date Not at risk for falls Depression screen [employer] 2/9 07/18/2015 06/07/2014 04/27/2012 03/02/2012  Decreased Interest 0 0 0 0  Down, Depressed, Hopeless 0 0 0 0  PHQ - 2 Score 0 0 0 0     Review of Systems  Constitutional: Negative for activity change, appetite change, fatigue and unexpected weight change.  HENT: Negative for trouble swallowing.   Eyes:       Glaucoma followed at [facility]  Respiratory:       COPD with asthma [facility] She is out of cough  medicine and requesting more cough syrup she uses occasionally with flares of her problem, especially during allergy season.  Cardiovascular: Negative for chest pain and leg swelling.       Reports a regular rhythm  Gastrointestinal: Negative for abdominal pain.  Genitourinary: Negative for difficulty urinating.  Neurological: Negative for headaches.  Psychiatric/Behavioral: Negative for  sleep disturbance and dysphoric mood.       Objective:   Physical Exam BP 119/76 mmHg  Pulse 65  Temp(Src) 98.4 F (36.9 C)  Resp 16  Ht _0  (1.6 m)  Wt 141 lb (63.957 kg)  BMI 24.98 kg/m2 HEENT clear No thyromegaly Heart regular Lungs clear Extremities clear without edema/good peripheral pulses Neurological intact Mood good affect appropriate       Assessment & Plan:  Annual Medicare wellness encounter  Routine labs - Plan: CBC with Differential/Platelet, Comprehensive metabolic panel, Lipid panel  Meds ordered this encounter  Medications  . HYDROcodone-homatropine (HYCODAN) 5-1.5 MG/5ML syrup    Sig: Take 5 mLs by mouth every 8 (eight) hours as needed for cough.    Dispense:  120 mL    Refill:  0  . Fluticasone-Salmeterol (ADVAIR DISKUS) 100-50 MCG/DOSE AEPB    Sig: Inhale 1 puff into the lungs every 12 (twelve) hours.    Dispense:  60 each    Refill:  5  . clonazePAM (KLONOPIN) 0.5 MG tablet    Sig: Take 1 tablet (0.5 mg total) by mouth 2 (two) times daily as needed for anxiety.    Dispense:  30 tablet    Refill:  2   We discussed transition to new primary care as I retire and I will direct her to a facility closer to her home in [place]  Addendum labs 3/17 Results for orders placed or performed in visit on 07/18/15  CBC with Differential/Platelet  Result Value Ref Range   WBC 4.6 4.0 - 10.5 K/uL   RBC 4.28 3.87 - 5.11 MIL/uL   Hemoglobin 12.8 12.0 - 15.0 g/dL   HCT 39.4 36.0 - 46.0 %   MCV 92.1 78.0 - 100.0 fL   MCH 29.9 26.0 - 34.0 pg   MCHC 32.5 30.0 - 36.0 g/dL   RDW 12.9 11.5 - 15.5 %   Platelets 196 150 - 400 K/uL   MPV 9.0 8.6 - 12.4 fL   Neutrophils Relative % 29 (L) 43 - 77 %   Neutro Abs 1.3 (L) 1.7 - 7.7 K/uL   Lymphocytes Relative 44 12 - 46 %   Lymphs Abs 2.0 0.7 - 4.0 K/uL   Monocytes Relative 12 3 - 12 %   Monocytes Absolute 0.6 0.1 - 1.0 K/uL   Eosinophils Relative 14 (H) 0 - 5 %   Eosinophils Absolute 0.6 0.0 - 0.7 K/uL    Basophils Relative 1 0 - 1 %   Basophils Absolute 0.0 0.0 - 0.1 K/uL   Smear Review Criteria for review not met   Comprehensive metabolic panel  Result Value Ref Range   Sodium 137 135 - 146 mmol/L   Potassium 4.1 3.5 - 5.3 mmol/L   Chloride 100 98 - 110 mmol/L   CO2 31 20 - 31 mmol/L   Glucose, Bld 82 65 - 99 mg/dL   BUN 13 7 - 25 mg/dL   Creat 0.99 (H) 0.60 - 0.93 mg/dL   Total Bilirubin 0.9 0.2 - 1.2 mg/dL   Alkaline Phosphatase 65 33 - 130 U/L   AST 21 10 - 35 U/L  ALT 13 6 - 29 U/L   Total Protein 7.6 6.1 - 8.1 g/dL   Albumin 4.3 3.6 - 5.1 g/dL   Calcium 9.4 8.6 - 10.4 mg/dL  Lipid panel  Result Value Ref Range   Cholesterol 177 125 - 200 mg/dL   Triglycerides 121 <150 mg/dL   HDL 62 >=46 mg/dL   Total CHOL/HDL Ratio 2.9 <=5.0 Ratio   VLDL 24 <30 mg/dL   LDL Cholesterol 91 <130 mg/dL   Excellent!

## 2015-07-20 ENCOUNTER — Encounter: Payer: Self-pay | Admitting: Internal Medicine

## 2015-10-27 ENCOUNTER — Ambulatory Visit (INDEPENDENT_AMBULATORY_CARE_PROVIDER_SITE_OTHER): Payer: BC Managed Care – PPO | Admitting: Family Medicine

## 2015-10-27 VITALS — BP 132/80 | HR 69 | Temp 98.4°F | Resp 16 | Ht 63.0 in | Wt 143.4 lb

## 2015-10-27 DIAGNOSIS — J309 Allergic rhinitis, unspecified: Secondary | ICD-10-CM | POA: Diagnosis not present

## 2015-10-27 DIAGNOSIS — R599 Enlarged lymph nodes, unspecified: Secondary | ICD-10-CM

## 2015-10-27 DIAGNOSIS — R591 Generalized enlarged lymph nodes: Secondary | ICD-10-CM

## 2015-10-27 DIAGNOSIS — J029 Acute pharyngitis, unspecified: Secondary | ICD-10-CM

## 2015-10-27 MED ORDER — CEPHALEXIN 500 MG PO CAPS: 500.0000 mg | ORAL_CAPSULE | Freq: Three times a day (TID) | ORAL | Status: DC

## 2015-10-27 MED ORDER — MONTELUKAST SODIUM 10 MG PO TABS: 10.0000 mg | ORAL_TABLET | Freq: Every day | ORAL | Status: DC

## 2015-10-27 MED ORDER — CETIRIZINE HCL 10 MG PO TABS: 10.0000 mg | ORAL_TABLET | Freq: Every day | ORAL | Status: DC

## 2015-10-27 NOTE — Patient Instructions (Addendum)
Please follow up with Dr. Lazarus SalinesWolicki next week    IF you received an x-ray today, you will receive an invoice from [facility]. Please contact [facility] at [phone] with questions or concerns regarding your invoice.   IF you received labwork today, you will receive an invoice from [facility]. Please contact [facility] at [phone] with questions or concerns regarding your invoice.   Our billing staff will not be able to assist you with questions regarding bills from these companies.  You will be contacted with the lab results as soon as they are available. The fastest way to get your results is to activate your My Chart account. Instructions are located on the last page of this paperwork. If you have not heard from us regarding the results in 2 weeks, please contact this office.     We recommend that you schedule a mammogram for breast cancer screening. Typically, you do not need a referral to do this. Please contact a local imaging center to schedule your mammogram.  [facility] - [phone]  *ask for the Radiology Department [facility] - [phone] or [phone]) [phone]  [facility] - [phone] [facility] - [phone] [facility] - [phone]  *ask for the Radiology Department [facility] - [phone]  *ask for the Radiology Department [facility] - [phone]  *ask for the Mammography Department [facility] - [phone]) [phone]

## 2015-10-27 NOTE — Progress Notes (Signed)
Subjective:    Patient ID: [name], female    DOB: 10/29/1940, 75 y.o.   MRN: [id number]  HPI This is a 75 yo female who presents today for refill of singulair. She also reports left sided facial swelling for 1 week. She reports frequent salivary gland stones and tonsil stones. She sees Dr. Lazarus SalinesWolicki for this problem but is unable to get an appointment because he is out of town. She has not run a fever. She has pain with chewing.  She has seasonal allergies and has been taking Clarinex. She doesn't think it is working as well as it was when she first started it. She is having large amount clear nasal drainage.   Past Medical History  Diagnosis Date  . HTN (hypertension)   . Atrial fibrillation (HCC)   . TIA (transient ischemic attack)   . Tuberculosis   . Aortic dilatation (HCC)   . Pneumonia   . Coronary artery disease   . Epistaxis, recurrent   . Anemia   . Glaucoma   . Heart murmur   . Ulcer   . Blood transfusion without reported diagnosis   . S/P tricuspid valve replacement   . H/O mitral valve replacement   . Allergy   . Anxiety   . CHF (congestive heart failure) (HCC)   . COPD (chronic obstructive pulmonary disease) (HCC)   . Stroke [facility](HCC)     tia   Past Surgical History  Procedure Laterality Date  . Cesarean section    . Cardiac surgery       Bentalll 25  mm Freestyle porcine aortic valve, mitral repair 26 mm reein, tricuspid repair 25 mm rein, biatrial maze, ligation of left atrial appendage 12/26/2011  . Nasal endoscopy with epistaxis control N/A 07/14/2012    Procedure: NASAL ENDOSCOPY WITH EPISTAXIS CONTROL;  Surgeon: Melvenia BeamMitchell Gore, MD;  [facility];  Service: ENT;  Laterality: N/A;  . Cardiac valve replacement     Family History  Problem Relation Age of Onset  . CVA Father 3244    Died 1383  . Blindness Father   . Colon cancer Neg Hx   . Esophageal cancer Neg Hx   . Rectal cancer Neg Hx   . Stomach cancer Neg Hx   . Stroke Mother   . Heart  murmur Sister    Social History  Substance Use Topics  . Smoking status: Never Smoker   . Smokeless tobacco: Never Used  . Alcohol Use: No      Review of Systems No fever or chills, + sore throat, + left sided neck pain, + runny nose, + itchy eyes, no chest pain, no SOB, no lower extremity edema    Objective:   Physical Exam  Constitutional: She appears well-developed and well-nourished. No distress.  HENT:  Head: Normocephalic and atraumatic.  Right Ear: External ear normal.  Left Ear: External ear normal.  Nose: Nose normal.  Mouth/Throat: Uvula is midline. Posterior oropharyngeal erythema (left) present. No oropharyngeal exudate or posterior oropharyngeal edema.  Eyes: Conjunctivae are normal.  Neck: Normal range of motion. Neck supple.    Skin: She is not diaphoretic.  Vitals reviewed.     BP 132/80 mmHg  Pulse 69  Temp(Src) 98.4 F (36.9 C) (Oral)  Resp 16  Ht 5\' 3"  (1.6 m)  Wt 143 lb 6.4 oz (65.046 kg)  BMI 25.41 kg/m2  SpO2 96% Wt Readings from Last 3 Encounters:  10/27/15 143 lb 6.4 oz (65.046 kg)  07/18/15  141 lb (63.957 kg)  09/30/14 136 lb 6 oz (61.859 kg)       Assessment & Plan:  Read Dr. Raye SorrowWolicki's note about previous similar episode. Will treat same.  1. Allergic rhinitis, unspecified allergic rhinitis type - montelukast (SINGULAIR) 10 MG tablet; Take 1 tablet (10 mg total) by mouth at bedtime.  Dispense: 90 tablet; Refill: 1  2. Swelling, lymph nodes - cephALEXin (KEFLEX) 500 MG capsule; Take 1 capsule (500 mg total) by mouth 3 (three) times daily.  Dispense: 21 capsule; Refill: 0 - she will follow up with Dr. Lazarus SalinesWolicki  3. Acute pharyngitis, unspecified etiology - cephALEXin (KEFLEX) 500 MG capsule; Take 1 capsule (500 mg total) by mouth 3 (three) times daily.  Dispense: 21 capsule; Refill: 0 - RTC precautions reviewed.  Olean Reeeborah Kiona Blume, FNP-BC  [facility]  10/31/2015 3:33 PM

## 2015-10-30 ENCOUNTER — Encounter: Payer: Self-pay | Admitting: Family Medicine

## 2015-12-02 ENCOUNTER — Encounter: Payer: Self-pay | Admitting: Physician Assistant

## 2016-07-16 ENCOUNTER — Ambulatory Visit: Payer: BC Managed Care – PPO | Admitting: Family Medicine

## 2017-01-12 ENCOUNTER — Ambulatory Visit (INDEPENDENT_AMBULATORY_CARE_PROVIDER_SITE_OTHER): Payer: BC Managed Care – PPO | Admitting: Family Medicine

## 2017-01-12 ENCOUNTER — Encounter: Payer: Self-pay | Admitting: Family Medicine

## 2017-01-12 VITALS — BP 127/83 | HR 83 | Temp 97.7°F | Resp 16

## 2017-01-12 DIAGNOSIS — J984 Other disorders of lung: Secondary | ICD-10-CM | POA: Diagnosis not present

## 2017-01-12 DIAGNOSIS — I4891 Unspecified atrial fibrillation: Secondary | ICD-10-CM

## 2017-01-12 DIAGNOSIS — R000 Tachycardia, unspecified: Secondary | ICD-10-CM | POA: Diagnosis not present

## 2017-01-12 NOTE — Patient Instructions (Addendum)
I do not see any sign of atrial fibrillation on your EKG today. We can fax that to your cardiologist, but would recommend you discuss the Cardizem dosing with your cardiologist prior to taking additional medication.  Return to the clinic or go to the nearest emergency room if any of your symptoms worsen or new symptoms occur.    IF you received an x-ray today, you will receive an invoice from [facility]. Please contact [facility] at [phone] with questions or concerns regarding your invoice.   IF you received labwork today, you will receive an invoice from [facility]. Please contact [facility] at [phone] with questions or concerns regarding your invoice.   Our billing staff will not be able to assist you with questions regarding bills from these companies.  You will be contacted with the lab results as soon as they are available. The fastest way to get your results is to activate your My Chart account. Instructions are located on the last page of this paperwork. If you have not heard from us regarding the results in 2 weeks, please contact this office.

## 2017-01-12 NOTE — Progress Notes (Signed)
Subjective:    Patient ID: [name], female    DOB: 06/09/1940, 76 y.o.   MRN: [id number]  HPI [name] is a 76 y.o. female Presents today for: Chief Complaint  Patient presents with  . Shortness of Breath  . Abnormal ECG    per her cardio  I'm listed as her primary care provider, but have not personally seen her since 2014. Been seen for intermittent acute symptoms by other providers at this office, most recently in June 2017.  She has history of multiple medical problems including COPD, atrial fibrillation, and restrictive lung disease.  She is under the care of [facility] Barber[facility] Manigle [facility] for her restrictive lung disease/pulmonary hypertension. Most recent note reviewed from August 31st.  previously treated with amiodarone with A. fib and RVR., with some signs of amiodarone-induced lung toxicity after treatment with amiodarone in March.  Subsequently was treated with prednisone. Had been down to prednisone 10 mg daily, then increase to 20 mg at most recent visit for worsening respiratory symptoms. Chest x-ray with some possible increased bilateral opacifications. There was some concern of possible component of volume overload. Also some concern of a possibility of primary pulmonary disease that was not adequately treated with high-dose prednisone taper. 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 Prednisone was increased to 20 mg at that visit. Was continued on oxygen 2 L nasal cannula with household activities, none at rest.  Today presents for EKG at the recommendation [facility]. On review of message,  patient noted increased pulse rate at 150 on September 7, then decreased to the 70s to 90s past few days. Dr. Wilford GristKoontz recommended EKG to make sure she was not in A. Fib.   Currently she is on Eliquis for anticoagulation, Cardizem 120 mg extended release daily for rate control. She states that heart rate went up when stressed a few days  ago - over 150. Took additional 3 doses of diltiazem over 24 hour period. Did not receive these instructions from cardiologist - went down to 52.  Took 1 and 1/2 pills of cardizem last night. Last known flair of Afib May 26th.     Patient Active Problem List   Diagnosis Date Noted  . Abnormal finding on thyroid function test 08/03/2013  . Chronic airway obstruction (HCC) 09/25/2012  . Moderate COPD (chronic obstructive pulmonary disease) (HCC) 09/25/2012  . Colon cancer screening 09/23/2012  . Personal history of drug therapy 07/13/2012  . Other long term (current) drug therapy 07/13/2012  . Esophageal dysmotility 06/22/2012  . Helicobacter pylori ab+ 05/27/2012  . Belching 05/27/2012  . Constipation 05/27/2012  . Anemia, unspecified 05/27/2012  . Long term current use of anticoagulant therapy 05/11/2012  . 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 CAD (coronary artery disease) of artery bypass graft 04/22/2012  . cardiac valve repairs 01/29/2012  . Aortic aneurysm (HCC) 01/07/2012  . Disease of tricuspid valve 01/07/2012  . Aortic valve disorder 01/07/2012  . 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 Cardiac complication 12/27/2011  . Cardiac arrhythmia 12/27/2011  . Congestive heart failure (HCC) 12/26/2011  . Coronary atherosclerosis 12/26/2011  . Esophageal reflux 12/26/2011  . CCF (congestive cardiac failure) (HCC) 12/26/2011  . Ascending aorta dilatation (HCC) 09/14/2011  . Mitral regurgitation 09/14/2011  . TIA (transient ischemic attack) 09/14/2011  . Transient cerebral ischemia 09/09/2011  . Personal history of tuberculosis 06/30/2011  . Mitral valve disorder 06/30/2011  . Persistent atrial fibrillation (  HCC) 06/30/2011  . Essential hypertension, benign 06/23/2011  . Atrial fibrillation (HCC) 06/23/2011   Past Medical History:  Diagnosis Date  . Allergy   . Anemia   . Anxiety   . Aortic dilatation (HCC)   . Atrial fibrillation (HCC)   . Blood  transfusion without reported diagnosis   . CHF (congestive heart failure) (HCC)   . COPD (chronic obstructive pulmonary disease) (HCC)   . Coronary artery disease   . Epistaxis, recurrent   . Glaucoma   . H/O mitral valve replacement   . Heart murmur   . HTN (hypertension)   . Pneumonia   . S/P tricuspid valve replacement   . Stroke (HCC)    tia  . TIA (transient ischemic attack)   . Tuberculosis   . Ulcer    Past Surgical History:  Procedure Laterality Date  . CARDIAC SURGERY      Bentalll 25  mm Freestyle porcine aortic valve, mitral repair 26 mm reein, tricuspid repair 25 mm rein, biatrial maze, ligation of left atrial appendage 12/26/2011  . CARDIAC VALVE REPLACEMENT    . CESAREAN SECTION    . NASAL ENDOSCOPY WITH EPISTAXIS CONTROL N/A 07/14/2012   Procedure: NASAL ENDOSCOPY WITH EPISTAXIS CONTROL;  Surgeon: Melvenia Beam, MD;  Location: [facility];  Service: ENT;  Laterality: N/A;   Allergies  Allergen Reactions  . Citrus     Per the pt it burns my stomach  . Other Other (See Comments)    MULBERRY- CANNOT BREATH  . Lactose Intolerance (Gi) Other (See Comments)    Causes bloating of bleching  . Milk-Related Compounds   . Monosodium Glutamate Other (See Comments)    Other reaction Headache, dizziness  . Penicillins Other (See Comments)    "simply doesn't work for me"    Prior to Admission medications   Medication Sig Start Date End Date Taking? Authorizing Provider  Apixaban (ELIQUIS PO) Take by mouth.   Yes [provider]  clonazePAM (KLONOPIN) 0.5 MG tablet Take 1 tablet (0.5 mg total) by mouth 2 (two) times daily as needed for anxiety. 07/18/15  Yes Tonye Pearson, MD  DENTA 5000 PLUS 1.1 % CREA dental cream  10/25/12  Yes [provider]  furosemide (LASIX) 20 MG tablet  10/21/15  Yes [provider]  latanoprost (XALATAN) 0.005 % ophthalmic solution 1 drop at bedtime.   Yes [provider]  Multiple Vitamin (MULTIVITAMIN) tablet  Take 1 tablet by mouth daily.   Yes [provider]  potassium chloride (K-DUR) 10 MEQ tablet Take 10 mEq by mouth 2 (two) times daily.   Yes [provider]  predniSONE (DELTASONE) 20 MG tablet Take 20 mg by mouth daily with breakfast.   Yes [provider]   Social History   Social History  . Marital status: Married    Spouse name: N/A  . Number of children: 1  . Years of education: N/A   Occupational History  . retired Retired   Social History Main Topics  . Smoking status: Never Smoker  . Smokeless tobacco: Never Used  . Alcohol use No  . Drug use: No  . Sexual activity: No   Other Topics Concern  . Not on file   Social History Narrative  . No narrative on file    Review of Systems  Respiratory: Positive for shortness of breath (no new shortenss of breath. ).   Cardiovascular: Positive for palpitations. Negative for chest pain and leg swelling.  Objective:   Physical Exam  Constitutional: She is oriented to person, place, and time. She appears well-developed and well-nourished. No distress.  HENT:  Head: Normocephalic and atraumatic.  Right Ear: Hearing, tympanic membrane, external ear and ear canal normal.  Left Ear: Hearing, tympanic membrane, external ear and ear canal normal.  Nose: Nose normal.  Mouth/Throat: Oropharynx is clear and moist. No oropharyngeal exudate.  Eyes: Pupils are equal, round, and reactive to light. Conjunctivae and EOM are normal.  Cardiovascular: Normal rate, regular rhythm, normal heart sounds and intact distal pulses.   No murmur heard. Pulmonary/Chest: Effort normal and breath sounds normal. No respiratory distress. She has no wheezes. She has no rhonchi.  Neurological: She is alert and oriented to person, place, and time.  Skin: Skin is warm and dry. No rash noted.  Psychiatric: She has a normal mood and affect. Her behavior is normal.  Vitals reviewed.   EKG:  Sinus rhythm, rate 79. Negative  precordial T waves, nonspecific, seen on previous EKG, no apparent changes.  Over 25 minutes of care provided with chart review and counseling greater than 50% of visit.      Assessment & Plan:   [name] is a 76 y.o. female Tachycardia - Plan: EKG 12-Lead  Atrial fibrillation, unspecified type (HCC) - Plan: EKG 12-Lead  Restrictive lung disease  History of atrial fibrillation, restrictive lung disease. Noted episode of tachycardia, and her specialist wanted her to be checked to make sure she was not in A. Fib. EKG sinus rhythm, normal rate.  - EKG faxed to cardiologist. Recommend patient not double up or increased dose of her Cardizem unless that plan was discussed with cardiology as risk for hypotension or bradycardia. Understanding expressed.   Meds ordered this encounter  Medications  . predniSONE (DELTASONE) 20 MG tablet    Sig: Take 20 mg by mouth daily with breakfast.  . Apixaban (ELIQUIS PO)    Sig: Take 5 mg by mouth 2 (two) times daily.    Patient Instructions   I do not see any sign of atrial fibrillation on your EKG today. We can fax that to your cardiologist, but would recommend you discuss the Cardizem dosing with your cardiologist prior to taking additional medication.  Return to the clinic or go to the nearest emergency room if any of your symptoms worsen or new symptoms occur.    IF you received an x-ray today, you will receive an invoice from [facility]. Please contact [facility] at [phone] with questions or concerns regarding your invoice.   IF you received labwork today, you will receive an invoice from [place]. Please contact [facility] at [phone] with questions or concerns regarding your invoice.   Our billing staff will not be able to assist you with questions regarding bills from these companies.  You will be contacted with the lab results as soon as they are available. The fastest way to get your results is to  activate your My Chart account. Instructions are located on the last page of this paperwork. If you have not heard from [place] regarding the results in 2 weeks, please contact this office.       Signed,   Meredith Staggers, MD Primary Care at [facility].  01/14/17 4:43 PM

## 2017-01-13 ENCOUNTER — Ambulatory Visit (INDEPENDENT_AMBULATORY_CARE_PROVIDER_SITE_OTHER): Payer: BC Managed Care – PPO | Admitting: Family Medicine

## 2017-01-13 ENCOUNTER — Encounter: Payer: Self-pay | Admitting: Family Medicine

## 2017-01-13 VITALS — BP 116/69 | HR 81 | Temp 97.9°F | Resp 16 | Ht 63.0 in | Wt 134.0 lb

## 2017-01-13 DIAGNOSIS — F439 Reaction to severe stress, unspecified: Secondary | ICD-10-CM | POA: Diagnosis not present

## 2017-01-13 DIAGNOSIS — R6884 Jaw pain: Secondary | ICD-10-CM | POA: Diagnosis not present

## 2017-01-13 DIAGNOSIS — K029 Dental caries, unspecified: Secondary | ICD-10-CM | POA: Diagnosis not present

## 2017-01-13 DIAGNOSIS — Z23 Encounter for immunization: Secondary | ICD-10-CM | POA: Diagnosis not present

## 2017-01-13 NOTE — Progress Notes (Signed)
Subjective:    Patient ID: [name] BaneMaureen K [name], female    DOB: 05/24/1940, 76 y.o.   MRN: [id number]  HPI [name] is a 76 y.o. female Presents today for: Chief Complaint  Patient presents with  . Jaw Pain    since May 15th   History of multiple medical problems, seen yesterday for EKG and request of her cardiologist, with previous tachycardia, history of chronic lung disease and restrictive lung disease followed by pulmonary and cardiology.. She did have other concerns at the conclusion of her visit yesterday and was asked to return to discuss those in more detail.  Jaw Pain: Has had some tooth sensitivity since 2000.  Has some receeding gums. Use prescribed dental paste form her dentist. Noticed sharp pain going through R upper teeth in 2016. Saw dentist.  Pain has intensified since May of this year, since starting the prednisone. Pain is in upper jaw at back teeth (wisdom teeth upper part removed, still has lower).  Has cavity in lower wisdom tooth, causing pain into jaw bone. L side of mouth/jaw feels fine. R lower jaw also sore since May.  Followed by dentist. Had xray saying ok, but they said "teeth are worn out" and concern for nerves being exposed. Plans to have lower wisdom tooth removed, but concerned about prednisone and recovery time. Saw ENT (Dr. Lazarus SalinesWolicki) as worried about sinuses causing pain, but had CT scan that was ok, and told not sinuses last month. They recommended a bite guard, but dentist did not agree.  Dentist recommended possible pain doctor.  ENT did not think it was form TMJ. Still discussing options for pain treatment with dentist. Pain does occur with opening/clsing her mouth. Hears click at times.  No chest pain. Feels like pain may worsen in jaw when pulling with right arm. Possible history of osteoporosis, [facility], has appointment coming up.  No known history of bisphosphonates. Sometimes pain in upper jaw with looking down at times -  when this occurs - she looks backwards, and projects her lower jaw forward and that makes pain go away. She would like to meet with oral surgeon.   Not taking any pain meds for jaw discomfort.   At the conclusion of her visit she asked that I give her names for psychologist. She denies any suicidal ideation, denies audio/visual/tactile hallucinations or delusions. She feels like she is not getting along with her son and stress with her various medical problems, and concerned about being at the end of her life. She would like to meet with psychologist first to discuss the symptoms.  Patient Active Problem List   Diagnosis Date Noted  . Abnormal finding on thyroid function test 08/03/2013  . Chronic airway obstruction (HCC) 09/25/2012  . Moderate COPD (chronic obstructive pulmonary disease) (HCC) 09/25/2012  . Colon cancer screening 09/23/2012  . Personal history of drug therapy 07/13/2012  . Other long term (current) drug therapy 07/13/2012  . Esophageal dysmotility 06/22/2012  . Helicobacter pylori ab+ 05/27/2012  . Belching 05/27/2012  . Constipation 05/27/2012  . Anemia, unspecified 05/27/2012  . Long term current use of anticoagulant therapy 05/11/2012  . 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 CAD (coronary artery disease) of artery bypass graft 04/22/2012  . cardiac valve repairs 01/29/2012  . Aortic aneurysm (HCC) 01/07/2012  . Disease of tricuspid valve 01/07/2012  . Aortic valve disorder 01/07/2012  . 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 Cardiac complication 12/27/2011  .  Cardiac arrhythmia 12/27/2011  . Congestive heart failure (HCC) 12/26/2011  . Coronary atherosclerosis 12/26/2011  . Esophageal reflux 12/26/2011  . CCF (congestive cardiac failure) (HCC) 12/26/2011  . Ascending aorta dilatation (HCC) 09/14/2011  . Mitral regurgitation 09/14/2011  . TIA (transient ischemic attack) 09/14/2011  . Transient cerebral ischemia 09/09/2011  . Personal history  of tuberculosis 06/30/2011  . Mitral valve disorder 06/30/2011  . Persistent atrial fibrillation (HCC) 06/30/2011  . Essential hypertension, benign 06/23/2011  . Atrial fibrillation (HCC) 06/23/2011   Past Medical History:  Diagnosis Date  . Allergy   . Anemia   . Anxiety   . Aortic dilatation (HCC)   . Atrial fibrillation (HCC)   . Blood transfusion without reported diagnosis   . CHF (congestive heart failure) (HCC)   . COPD (chronic obstructive pulmonary disease) (HCC)   . Coronary artery disease   . Epistaxis, recurrent   . Glaucoma   . H/O mitral valve replacement   . Heart murmur   . HTN (hypertension)   . Pneumonia   . S/P tricuspid valve replacement   . Stroke (HCC)    tia  . TIA (transient ischemic attack)   . Tuberculosis   . Ulcer    Past Surgical History:  Procedure Laterality Date  . CARDIAC SURGERY      Bentalll 25  mm Freestyle porcine aortic valve, mitral repair 26 mm reein, tricuspid repair 25 mm rein, biatrial maze, ligation of left atrial appendage 12/26/2011  . CARDIAC VALVE REPLACEMENT    . CESAREAN SECTION    . NASAL ENDOSCOPY WITH EPISTAXIS CONTROL N/A 07/14/2012   Procedure: NASAL ENDOSCOPY WITH EPISTAXIS CONTROL;  Surgeon: Melvenia Beam, MD;  Location: [facility] OR;  Service: ENT;  Laterality: N/A;   Allergies  Allergen Reactions  . Citrus     Per the pt it burns my stomach  . Other Other (See Comments)    MULBERRY- CANNOT BREATH  . Lactose Intolerance (Gi) Other (See Comments)    Causes bloating of bleching  . Milk-Related Compounds   . Monosodium Glutamate Other (See Comments)    Other reaction Headache, dizziness  . Penicillins Other (See Comments)    "simply doesn't work for me"    Prior to Admission medications   Medication Sig Start Date End Date Taking? Authorizing Provider  Apixaban (ELIQUIS PO) Take 5 mg by mouth 2 (two) times daily.    Yes [provider]  clonazePAM (KLONOPIN) 0.5 MG tablet Take 1 tablet (0.5 mg total) by  mouth 2 (two) times daily as needed for anxiety. 07/18/15  Yes Tonye Pearson, MD  DENTA 5000 PLUS 1.1 % CREA dental cream  10/25/12  Yes [provider]  diltiazem (CARDIZEM) 120 MG tablet Take 120 mg by mouth 4 (four) times daily.   Yes [provider]  furosemide (LASIX) 20 MG tablet  10/21/15  Yes [provider]  latanoprost (XALATAN) 0.005 % ophthalmic solution 1 drop at bedtime.   Yes [provider]  Multiple Vitamin (MULTIVITAMIN) tablet Take 1 tablet by mouth daily.   Yes [provider]  pantoprazole (PROTONIX) 40 MG tablet Take 40 mg by mouth daily.   Yes [provider]  potassium chloride (K-DUR) 10 MEQ tablet Take 10 mEq by mouth 2 (two) times daily.   Yes [provider]  predniSONE (DELTASONE) 20 MG tablet Take 20 mg by mouth daily with breakfast.   Yes [provider]   Social History   Social History  .  Marital status: Married    Spouse name: N/A  . Number of children: 1  . Years of education: N/A   Occupational History  . retired Retired   Social History Main Topics  . Smoking status: Never Smoker  . Smokeless tobacco: Never Used  . Alcohol use No  . Drug use: No  . Sexual activity: No   Other Topics Concern  . Not on file   Social History Narrative  . No narrative on file    Review of Systems  Constitutional: Negative for fever.  HENT: Negative for ear pain.   Musculoskeletal: Positive for arthralgias (R jaw. ).  Psychiatric/Behavioral: Positive for dysphoric mood. Negative for hallucinations and suicidal ideas. The patient is nervous/anxious.        Objective:   Physical Exam  Constitutional: She is oriented to person, place, and time. She appears well-developed and well-nourished.  HENT:  Head: Normocephalic and atraumatic.  No focal bony tenderness of jaw, no soft tissue swelling or facial swelling. No apparent lymphadenopathy. Slight discomfort in jaw with complete  forward flexion of neck, but no other neck pain. Dental exam, does seem to have some wear of her lower teeth without apparent decay otherwise, no surrounding gum erythema there is a bony prominence lingular aspect upper jaw posteriorly.  Eyes: Pupils are equal, round, and reactive to light. Conjunctivae and EOM are normal.  Neck: Carotid bruit is not present.  Cardiovascular: Normal rate, regular rhythm, normal heart sounds and intact distal pulses.   Pulmonary/Chest: Effort normal and breath sounds normal.  Abdominal: Soft. She exhibits no pulsatile midline mass. There is no tenderness.  Neurological: She is alert and oriented to person, place, and time.  Skin: Skin is warm and dry.  Psychiatric: She has a normal mood and affect. Her behavior is normal.  Vitals reviewed.  Vitals:   01/13/17 1115  BP: 116/69  Pulse: 81  Resp: 16  Temp: 97.9 F (36.6 C)  TempSrc: Oral  SpO2: 91%  Weight: 134 lb (60.8 kg)  Height:  (1.6 m)   Approximately 30 minutes of face-to-face care. over 50% counseling.    Assessment & Plan:   [name] is a 76 y.o. female Need for prophylactic vaccination and inoculation against influenza - Plan: Flu Vaccine QUAD 36+ mos IM  Jaw pain - Plan: Ambulatory referral to Oral Maxillofacial Surgery  Situational stress  Dental decay - Plan: Ambulatory referral to Oral Maxillofacial Surgery  Possible multiple causes of pain, including reported that decay. less likely TMJ based on location. Denies bisphosphonate use, less likely osteonecrosis of the jaw. Recent CT, but appears to be paranasal sinuses, did not see attention to mandible.  - Recommended discussing dental procedures with dentist first as well as with her pulmonologist if she needed to stop prednisone for those procedures  - with persistent/lonmgstanding jaw pain will refer to maxillofacial surgeon to discuss other potential causes, or possible imaging with CT.  Situational  stressors/anxiety - noted at end of visit. Requested names to counseling, numbers provided, but advised to return to discuss the symptoms further to consider medication if needed.  Patient Instructions   I would recommend discussing wisdom tooth procedure and recovery time with your dentist. Exposed nerve can also also cause pain. I would recommend discussing dental procedures with your dentist first, then have them call me or send me a note if there are other concerns that I need to work up. I would also recommend coordinating any procedures and need  to come off prednisone with your lung specialist.   At your request, I will refer you to an oral surgeon to discuss any further workup or imaging needed for your jaw pain.   Here are some names of counselors, but please return to discuss anxiety or depression symptoms further.  Karmen Bongo: [phone] [place] Ped: [phone]   Return to the clinic or go to the nearest emergency room if any of your symptoms worsen or new symptoms occur.   IF you received an x-ray today, you will receive an invoice from [facility]. Please contact [facility] at [phone] with questions or concerns regarding your invoice.   IF you received labwork today, you will receive an invoice from [place]. Please contact [facility] at [phone] with questions or concerns regarding your invoice.   Our billing staff will not be able to assist you with questions regarding bills from these companies.  You will be contacted with the lab results as soon as they are available. The fastest way to get your results is to activate your My Chart account. Instructions are located on the last page of this paperwork. If you have not heard from [place] regarding the results in 2 weeks, please contact this office.      Signed,   Meredith Staggers, MD Primary Care at [facility].  01/16/17 5:33 PM

## 2017-01-13 NOTE — Patient Instructions (Addendum)
I would recommend discussing wisdom tooth procedure and recovery time with your dentist. Exposed nerve can also also cause pain. I would recommend discussing dental procedures with your dentist first, then have them call me or send me a note if there are other concerns that I need to work up. I would also recommend coordinating any procedures and need to come off prednisone with your lung specialist.   At your request, I will refer you to an oral surgeon to discuss any further workup or imaging needed for your jaw pain.   Here are some names of counselors, but please return to discuss anxiety or depression symptoms further.  Karmen BongoAaron Stewart: [phone] [place] PedClaire Huprich:: [phone]   Return to the clinic or go to the nearest emergency room if any of your symptoms worsen or new symptoms occur.   IF you received an x-ray today, you will receive an invoice [facility]. Please contact [facility] at [phone] with questions or concerns regarding your invoice.   IF you received labwork today, you will receive an invoice from [facility]. Please contact [facility] at [phone] with questions or concerns regarding your invoice.   Our billing staff will not be able to assist you with questions regarding bills from these companies.  You will be contacted with the lab results as soon as they are available. The fastest way to get your results is to activate your My Chart account. Instructions are located on the last page of this paperwork. If you have not heard from us regarding the results in 2 weeks, please contact this office.

## 2018-02-26 ENCOUNTER — Ambulatory Visit
Admission: RE | Admit: 2018-02-26 | Discharge: 2018-02-26 | Disposition: A | Discharge status: Home / Self Care | Payer: BC Managed Care – PPO | Source: Ambulatory Visit | Attending: Internal Medicine | Admitting: Internal Medicine

## 2018-02-26 ENCOUNTER — Other Ambulatory Visit: Payer: Self-pay | Admitting: Internal Medicine

## 2018-02-26 DIAGNOSIS — E064 Drug-induced thyroiditis: Secondary | ICD-10-CM | POA: Diagnosis present

## 2018-02-26 DIAGNOSIS — R509 Fever, unspecified: Secondary | ICD-10-CM

## 2018-02-26 DIAGNOSIS — I700 Atherosclerosis of aorta: Secondary | ICD-10-CM | POA: Insufficient documentation

## 2018-02-26 DIAGNOSIS — I517 Cardiomegaly: Secondary | ICD-10-CM | POA: Diagnosis not present

## 2018-02-26 DIAGNOSIS — T462X5A Adverse effect of other antidysrhythmic drugs, initial encounter: Secondary | ICD-10-CM | POA: Diagnosis not present

## 2018-04-04 DEATH — deceased
